# Patient Record
Sex: Female | Born: 1940
Health system: Southern US, Community
[De-identification: ages and names within clinical notes are randomized; demographics above are authoritative.]

## PROBLEM LIST (undated history)

## (undated) DIAGNOSIS — E785 Hyperlipidemia, unspecified: Secondary | ICD-10-CM

## (undated) DIAGNOSIS — U071 COVID-19: Secondary | ICD-10-CM

## (undated) DIAGNOSIS — L719 Rosacea, unspecified: Secondary | ICD-10-CM

## (undated) DIAGNOSIS — F329 Major depressive disorder, single episode, unspecified: Secondary | ICD-10-CM

## (undated) DIAGNOSIS — F32A Depression, unspecified: Secondary | ICD-10-CM

## (undated) DIAGNOSIS — C4490 Unspecified malignant neoplasm of skin, unspecified: Secondary | ICD-10-CM

## (undated) DIAGNOSIS — M810 Age-related osteoporosis without current pathological fracture: Secondary | ICD-10-CM

## (undated) HISTORY — DX: Unspecified malignant neoplasm of skin, unspecified: C44.90

## (undated) HISTORY — PX: OTHER SURGICAL HISTORY: SHX169

## (undated) HISTORY — PX: POLYPECTOMY: SHX149

## (undated) HISTORY — DX: Major depressive disorder, single episode, unspecified: F32.9

## (undated) HISTORY — DX: Depression, unspecified: F32.A

## (undated) HISTORY — DX: Rosacea, unspecified: L71.9

## (undated) HISTORY — DX: Hyperlipidemia, unspecified: E78.5

## (undated) HISTORY — PX: HEMORRHOID SURGERY: SHX153

## (undated) HISTORY — PX: COLONOSCOPY: SHX174

## (undated) HISTORY — PX: PARTIAL HYSTERECTOMY: SHX80

## (undated) HISTORY — DX: Age-related osteoporosis without current pathological fracture: M81.0

---

## 1999-08-29 ENCOUNTER — Other Ambulatory Visit: Admission: RE | Admit: 1999-08-29 | Discharge: 1999-08-29 | Payer: Self-pay | Admitting: Family Medicine

## 2000-06-23 ENCOUNTER — Encounter: Payer: Self-pay | Admitting: Family Medicine

## 2000-06-23 ENCOUNTER — Encounter: Admission: RE | Admit: 2000-06-23 | Discharge: 2000-06-23 | Payer: Self-pay | Admitting: Family Medicine

## 2003-08-10 ENCOUNTER — Encounter: Payer: Self-pay | Admitting: Family Medicine

## 2003-08-10 ENCOUNTER — Encounter: Admission: RE | Admit: 2003-08-10 | Discharge: 2003-08-10 | Payer: Self-pay | Admitting: Family Medicine

## 2003-10-05 ENCOUNTER — Other Ambulatory Visit: Admission: RE | Admit: 2003-10-05 | Discharge: 2003-10-05 | Payer: Self-pay | Admitting: Family Medicine

## 2003-10-20 LAB — FECAL OCCULT BLOOD, GUAIAC: Fecal Occult Blood: NEGATIVE

## 2003-12-19 LAB — HM DEXA SCAN

## 2004-10-08 ENCOUNTER — Ambulatory Visit: Payer: Self-pay | Admitting: Family Medicine

## 2004-10-14 ENCOUNTER — Ambulatory Visit: Payer: Self-pay | Admitting: Family Medicine

## 2004-10-29 ENCOUNTER — Ambulatory Visit: Payer: Self-pay | Admitting: Family Medicine

## 2004-12-04 ENCOUNTER — Ambulatory Visit: Payer: Self-pay | Admitting: Family Medicine

## 2005-03-27 ENCOUNTER — Ambulatory Visit: Payer: Self-pay | Admitting: Family Medicine

## 2006-08-27 ENCOUNTER — Encounter: Payer: Self-pay | Admitting: Family Medicine

## 2006-08-27 ENCOUNTER — Ambulatory Visit: Payer: Self-pay | Admitting: Family Medicine

## 2006-08-27 ENCOUNTER — Other Ambulatory Visit: Admission: RE | Admit: 2006-08-27 | Discharge: 2006-08-27 | Payer: Self-pay | Admitting: Family Medicine

## 2006-09-04 ENCOUNTER — Ambulatory Visit: Payer: Self-pay | Admitting: Family Medicine

## 2006-10-06 ENCOUNTER — Ambulatory Visit: Payer: Self-pay | Admitting: Family Medicine

## 2007-06-28 ENCOUNTER — Ambulatory Visit: Payer: Self-pay | Admitting: Family Medicine

## 2007-06-28 LAB — CONVERTED CEMR LAB
Bacteria, UA: 0
Bilirubin Urine: NEGATIVE
Blood in Urine, dipstick: NEGATIVE
Glucose, Urine, Semiquant: NEGATIVE
Ketones, urine, test strip: NEGATIVE
Nitrite: NEGATIVE
Protein, U semiquant: NEGATIVE
Specific Gravity, Urine: <1.005
Urobilinogen, UA: NEGATIVE
pH: 7

## 2007-12-13 ENCOUNTER — Telehealth: Payer: Self-pay | Admitting: Family Medicine

## 2007-12-13 ENCOUNTER — Ambulatory Visit: Payer: Self-pay | Admitting: Family Medicine

## 2008-01-10 ENCOUNTER — Encounter: Admission: RE | Admit: 2008-01-10 | Discharge: 2008-01-10 | Payer: Self-pay | Admitting: Family Medicine

## 2008-01-10 ENCOUNTER — Telehealth: Payer: Self-pay | Admitting: Family Medicine

## 2008-01-12 ENCOUNTER — Ambulatory Visit: Payer: Self-pay | Admitting: Family Medicine

## 2008-01-12 DIAGNOSIS — E7800 Pure hypercholesterolemia, unspecified: Secondary | ICD-10-CM | POA: Insufficient documentation

## 2008-01-12 DIAGNOSIS — F32A Depression, unspecified: Secondary | ICD-10-CM | POA: Insufficient documentation

## 2008-01-12 DIAGNOSIS — F329 Major depressive disorder, single episode, unspecified: Secondary | ICD-10-CM

## 2008-01-12 DIAGNOSIS — E785 Hyperlipidemia, unspecified: Secondary | ICD-10-CM | POA: Insufficient documentation

## 2008-01-12 DIAGNOSIS — L719 Rosacea, unspecified: Secondary | ICD-10-CM

## 2009-04-13 ENCOUNTER — Ambulatory Visit: Payer: Self-pay | Admitting: Family Medicine

## 2009-04-27 ENCOUNTER — Ambulatory Visit: Payer: Self-pay | Admitting: Family Medicine

## 2009-05-11 ENCOUNTER — Encounter: Admission: RE | Admit: 2009-05-11 | Discharge: 2009-05-11 | Payer: Self-pay | Admitting: Family Medicine

## 2009-05-18 ENCOUNTER — Encounter: Admission: RE | Admit: 2009-05-18 | Discharge: 2009-05-18 | Payer: Self-pay | Admitting: Family Medicine

## 2009-05-28 ENCOUNTER — Ambulatory Visit: Payer: Self-pay | Admitting: Family Medicine

## 2010-12-04 ENCOUNTER — Encounter
Admission: RE | Admit: 2010-12-04 | Discharge: 2010-12-04 | Payer: Self-pay | Source: Home / Self Care | Attending: Family Medicine | Admitting: Family Medicine

## 2010-12-06 ENCOUNTER — Encounter: Payer: Self-pay | Admitting: Family Medicine

## 2010-12-06 ENCOUNTER — Encounter (INDEPENDENT_AMBULATORY_CARE_PROVIDER_SITE_OTHER): Payer: Self-pay | Admitting: *Deleted

## 2010-12-19 NOTE — Letter (Signed)
Summary: Results Follow up Letter  [place] [facility]  [address] [place], [place] [address]   Phone: [phone]  Fax: [phone]    12/06/2010 MRN: [id number]  Connie Bell [address] [place], [place]  [address]  Dear Ms. Vanderheyden,  The following are the results of your recent test(s):  Test         Result    Pap Smear:        Normal _____  Not Normal _____ Comments: ______________________________________________________ Cholesterol: LDL(Bad cholesterol):         Your goal is less than:         HDL (Good cholesterol):       Your goal is more than: Comments:  ______________________________________________________ Mammogram:        Normal __X___  Not Normal _____ Comments: Repeat in 1 year ___________________________________________________________________ Hemoccult:        Normal _____  Not normal _______ Comments:    _____________________________________________________________________ Other Tests:    We routinely do not discuss normal results over the telephone.  If you desire a copy of the results, or you have any questions about this information we can discuss them at your next office visit.   Sincerely,      Dr. Roxy Manns

## 2010-12-19 NOTE — Miscellaneous (Signed)
Summary: Mammgram to flow sheet  Clinical Lists Changes  Observations: Added new observation of MAMMO DUE: 12/2011 (12/06/2010 8:23) Added new observation of MAMMOGRAM: normal (12/04/2010 8:24)      Preventive Care Screening  Mammogram:    Date:  12/04/2010    Next Due:  12/2011    Results:  normal

## 2011-01-08 ENCOUNTER — Ambulatory Visit (INDEPENDENT_AMBULATORY_CARE_PROVIDER_SITE_OTHER): Payer: Medicare Other | Admitting: Family Medicine

## 2011-01-08 ENCOUNTER — Encounter: Payer: Self-pay | Admitting: Family Medicine

## 2011-01-08 DIAGNOSIS — K648 Other hemorrhoids: Secondary | ICD-10-CM

## 2011-01-14 NOTE — Assessment & Plan Note (Signed)
Summary: HEMROIDS/RBH   Vital Signs:  Patient profile:   70 year old female Weight:      164 pounds BMI:     28.47 Temp:     98.1 degrees F oral Pulse rate:   80 / minute Pulse rhythm:   regular BP sitting:   140 / 90  (left arm) Cuff size:   large  Vitals Entered By: Selena Batten Dance CMA Duncan Dull) (January 08, 2011 2:05 PM) CC: ? Hemorrhoids   History of Present Illness: ? 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 Morphine 2)  ! Lipitor 3)  ! Fosamax  Past History:  Past Medical History: Last updated: 04/13/2009 Depression rosacea hyperlipidemia   Past Surgical History: Last updated: 2008-01-18 Hysterectomy-partial, secondary to abn pap Dexa- OP, pt refuses treatment (12/2003) Hemorrhoidectomy (09/2006) Pt refuses mammograms, chol treatmen, vaccines  Family History: Last updated: January 18, 2008 Father: CHF Mother: died age 61- MI Siblings: 2 brothers, 2 sisters- ok.  1 sister died from lung cancer age 55  Social History: Last updated: 04/13/2009 Never Smoked Marital Status: widowed- husband was suicide Children: twins Occupation: retired non smoker  Risk Factors: Smoking Status: never (12/13/2007)  Review of Systems General:  Denies fatigue, fever, loss of appetite, and malaise. CV:  Denies chest pain or discomfort and palpitations. Resp:  Denies cough and wheezing. GI:  Complains of bloody stools and hemorrhoids; denies abdominal pain, change in bowel habits, constipation, dark tarry stools, diarrhea, gas, nausea, vomiting, and vomiting blood. GU:  Denies hematuria. Derm:  Complains of itching; denies  poor wound healing. Endo:  Denies excessive thirst and excessive urination. Heme:  Denies abnormal bruising.  Physical Exam  General:  Well-developed,well-nourished,in no acute distress; alert,appropriate and cooperative throughout examination Head:  normocephalic, atraumatic, and no abnormalities observed.   Neck:  supple with full rom and no masses or thyromegally, no JVD or carotid bruit  Lungs:  Normal respiratory effort, chest expands symmetrically. Lungs are clear to auscultation, no crackles or wheezes. Heart:  Normal rate and regular rhythm. S1 and S2 normal without gallop, murmur, click, rub or other extra sounds. Abdomen:  Bowel sounds positive,abdomen soft and non-tender without masses, organomegaly or hernias noted. no suprapubic tenderness or fullness felt  Rectal:  nl tone  on anoscopy a moderately large hemmorhoid seen at 4:00 -non thrombosed and not actively bleeding  some small ext hem noted heme neg stool noted nontender Skin:  Intact without suspicious lesions or rashes Inguinal Nodes:  No significant adenopathy Psych:  normal affect, talkative and pleasant    Impression & Recommendations:  Problem # 1:  HEMORRHOIDS, INTERNAL (ICD-455.0) Assessment New  with one episode of bleeding  poss exac by lifting at work  hx of surgery in past  one large non thrombosed int hem seen on anoscpy today handout given on hem from [facility]  will tx with anusol hc suppos at bedtime for 14 days update if not imp  Orders: Prescription Created Electronically [phone]) Anoscopy (74259)  Complete Medication List: 1)  Calcium 600/vitamin D 600-200 Mg-unit  Tabs (Calcium carbonate-vitamin d) .... Take 1 tablet by mouth two times a day 2)  Fish Oil 1000 Mg Caps (Omega-3 fatty acids) .... Take 1 tablet by mouth once a day 3)  Anusol-hc 25 Mg Supp (Hydrocortisone acetate) .Marland Kitchen.. 1 per rectum at bedtime for 14 days for hemorroids  Patient Instructions: 1)  use anusol hc suppositories each  bedtime for 10-14 days  2)  avoid straining  3)  update me if symptoms return or worsen  Prescriptions: ANUSOL-HC 25 MG SUPP (HYDROCORTISONE ACETATE) 1 per rectum at bedtime for 14 days for hemorroids  #14 x 0   Entered and Authorized by:   Judith Part MD   Signed by:   Judith Part MD on 01/08/2011   Method used:   Electronically to        [facility]  [address] (retail)       [address]       [place], [place]  [address]       Ph: [phone] or [phone]       Fax: [phone]   RxID:   [id number]    Orders Added: 1)  Prescription Created Electronically [G8553] 2)  Est. Patient Level III [95638] 3)  Anoscopy [46600]    Current Allergies (reviewed today): ! MORPHINE ! LIPITOR ! FOSAMAX

## 2011-10-27 ENCOUNTER — Encounter: Payer: Self-pay | Admitting: Family Medicine

## 2011-10-28 ENCOUNTER — Ambulatory Visit (INDEPENDENT_AMBULATORY_CARE_PROVIDER_SITE_OTHER): Payer: Medicare Other | Admitting: Family Medicine

## 2011-10-28 ENCOUNTER — Encounter: Payer: Self-pay | Admitting: Family Medicine

## 2011-10-28 DIAGNOSIS — J40 Bronchitis, not specified as acute or chronic: Secondary | ICD-10-CM

## 2011-10-28 DIAGNOSIS — K649 Unspecified hemorrhoids: Secondary | ICD-10-CM | POA: Insufficient documentation

## 2011-10-28 MED ORDER — GUAIFENESIN-CODEINE 100-10 MG/5ML PO SYRP: 5.0000 mL | ORAL_SOLUTION | Freq: Two times a day (BID) | ORAL | Status: AC | PRN

## 2011-10-28 MED ORDER — GUAIFENESIN-CODEINE 100-10 MG/5ML PO SYRP: 180.0000 mL | ORAL_SOLUTION | Freq: Two times a day (BID) | ORAL | Status: DC | PRN

## 2011-10-28 MED ORDER — HYDROCORTISONE 2.5 % RE CREA: TOPICAL_CREAM | RECTAL | Status: AC

## 2011-10-28 MED ORDER — AZITHROMYCIN 250 MG PO TABS: ORAL_TABLET | ORAL | Status: DC

## 2011-10-28 NOTE — Assessment & Plan Note (Signed)
Given lung findings, will treat aggressively with zpack. cheratussin for cough. Update [place] if sxs not improving as expected or worsening.

## 2011-10-28 NOTE — Assessment & Plan Note (Signed)
Known ext hemorrhoids per pt. Treat with anusol HC, warm sitz baths.  Pt advised to return if not improving with this measure for further evaluation.

## 2011-10-28 NOTE — Progress Notes (Signed)
Addended by: Eustaquio Boyden on: 10/28/2011 09:02 AM   Modules accepted: Orders

## 2011-10-28 NOTE — Patient Instructions (Signed)
For hemorrhoids - use anusol HC cream twice daily for 10 days.  Return if not improving or worsening. For cough - you have bronchitis.  Treat with zpack and cheratussin for cough at night. Get plenty of fluids and plenty of rest. If fever >101.5, or worsening productive cough please return to be seen again. Call us with questions.

## 2011-10-28 NOTE — Progress Notes (Signed)
  Subjective:    Patient ID: Connie Bell, female    DOB: [birth year], 70 y.o.   MRN: [id number]  HPI CC: cough  70 yo with h/o HLD presents with 5d h/o ST, fever to 100.9.  Went home from work sick.  Mild nausea, diarrhea over weekend.  Remaining cough and chest congestion.  + arthralgia.  Mild SOB.  + fatigue.  Dry cough, no more fevers.  Has been trying to get in for OV for last 3 days.  So far has tried delsym, advil.  No abd pain, ear pain, tooth pain, rash, myalgia, chest pain.  Works at school.  No smokers at home.  No h/o asthma/COPD.  Also 1 wk h/o external hemorrhoids acting up.  H/o hemorrhoids, s/p surgery 2 yrs ago.  Some bleeding.  Has tried prep H and suppositories.  Review of Systems Per HPI    Objective:   Physical Exam  Vitals reviewed. Constitutional: She appears well-developed and well-nourished. No distress.  HENT:  Head: Normocephalic and atraumatic.  Right Ear: Hearing, tympanic membrane, external ear and ear canal normal.  Left Ear: Hearing, tympanic membrane, external ear and ear canal normal.  Nose: No mucosal edema or rhinorrhea. Right sinus exhibits no maxillary sinus tenderness and no frontal sinus tenderness. Left sinus exhibits no maxillary sinus tenderness and no frontal sinus tenderness.  Mouth/Throat: Uvula is midline, oropharynx is clear and moist and mucous membranes are normal. No oropharyngeal exudate, posterior oropharyngeal edema, posterior oropharyngeal erythema or tonsillar abscesses.  Eyes: Conjunctivae and EOM are normal. Pupils are equal, round, and reactive to light. No scleral icterus.  Neck: Normal range of motion. Neck supple. No JVD present. No thyromegaly present.  Cardiovascular: Normal rate, regular rhythm, normal heart sounds and intact distal pulses.   No murmur heard. Pulmonary/Chest: Effort normal. No respiratory distress. She has no decreased breath sounds. She has no wheezes. She has rhonchi. She has rales in the left  middle field and the left lower field.  Lymphadenopathy:    She has no cervical adenopathy.  Skin: Skin is warm and dry. No rash noted.       Assessment & Plan:

## 2012-02-17 ENCOUNTER — Encounter: Payer: Self-pay | Admitting: Family Medicine

## 2012-02-17 ENCOUNTER — Ambulatory Visit (INDEPENDENT_AMBULATORY_CARE_PROVIDER_SITE_OTHER): Payer: Medicare Other | Admitting: Family Medicine

## 2012-02-17 VITALS — BP 142/84 | HR 84 | Temp 98.5°F | Wt 165.0 lb

## 2012-02-17 DIAGNOSIS — J40 Bronchitis, not specified as acute or chronic: Secondary | ICD-10-CM

## 2012-02-17 MED ORDER — AZITHROMYCIN 250 MG PO TABS: ORAL_TABLET | ORAL | Status: AC

## 2012-02-17 MED ORDER — HYDROCOD POLST-CHLORPHEN POLST 10-8 MG/5ML PO LQCR: 5.0000 mL | Freq: Two times a day (BID) | ORAL | Status: DC | PRN

## 2012-02-17 NOTE — Patient Instructions (Signed)
Good to see you. Please take Zpack as directed. Continue Mucinex, take Tussionex as needed at night.

## 2012-02-17 NOTE — Progress Notes (Signed)
SUBJECTIVE:  Connie Bell is a 71 y.o. female who complains of dry cough, myalgias and headache for 4 days. She denies a history of anorexia, chest pain, chills, dizziness, shortness of breath, vomiting and weakness and denies a history of asthma. Patient denies smoke cigarettes.   Patient Active Problem List  Diagnoses  . HYPERCHOLESTEROLEMIA  . DEPRESSION  . ROSACEA  . HEMORRHOIDS, INTERNAL  . Bronchitis  . Hemorrhoids   Past Medical History  Diagnosis Date  . Depression   . Rosacea   . Hyperlipemia    Past Surgical History  Procedure Date  . Partial hysterectomy     secondary to abn pap  . Hemorrhoid surgery   . Patient refuses     mammograms, chol treatment and vaccines   History  Substance Use Topics  . Smoking status: Never Smoker   . Smokeless tobacco: Never Used  . Alcohol Use: No   Family History  Problem Relation Age of Onset  . Heart disease Mother     MI  . Heart disease Father     CHF  . Cancer Sister 83    lung cancer   Allergies  Allergen Reactions  . Alendronate Sodium     REACTION: depressed, headaches, stomach aches  . Atorvastatin     REACTION: u/k  . Morphine     REACTION: u/k   Current Outpatient Prescriptions on File Prior to Visit  Medication Sig Dispense Refill  . Calcium Carbonate-Vitamin D 600-200 MG-UNIT TABS Take by mouth daily.        . Cholecalciferol (VITAMIN D) 2000 UNITS CAPS Take by mouth daily.        . hydrocortisone (ANUSOL-HC) 2.5 % rectal cream Apply rectally 2 times daily  30 g  1  . Omega-3 Fatty Acids (FISH OIL) 1000 MG CAPS Take by mouth daily.         The PMH, PSH, Social History, Family History, Medications, and allergies have been reviewed in [facility], and have been updated if relevant.  OBJECTIVE: BP 142/84  Pulse 84  Temp(Src) 98.5 F (36.9 C) (Oral)  Wt 165 lb (74.844 kg)  SpO2 96%  She appears well, vital signs are as noted. Ears normal.  Throat and pharynx normal.  Neck supple. No adenopathy in the  neck. Nose is congested. Sinuses non tender. Pos rales LLL, otherwise clear.  ASSESSMENT:  bronchitis  PLAN: Due to abnormal lung sounds, will treat with Zpack. Tussionex as needed for cough (she states she is not allergic). Symptomatic therapy suggested: push fluids, rest and return office visit prn if symptoms persist or worsen.  Call or return to clinic prn if these symptoms worsen or fail to improve as anticipated.

## 2012-08-06 ENCOUNTER — Other Ambulatory Visit: Payer: Self-pay | Admitting: Family Medicine

## 2012-08-06 DIAGNOSIS — Z1231 Encounter for screening mammogram for malignant neoplasm of breast: Secondary | ICD-10-CM

## 2012-09-02 ENCOUNTER — Encounter: Payer: Self-pay | Admitting: *Deleted

## 2012-09-02 ENCOUNTER — Ambulatory Visit
Admission: RE | Admit: 2012-09-02 | Discharge: 2012-09-02 | Disposition: A | Discharge status: Home / Self Care | Payer: Medicare Other | Source: Ambulatory Visit | Attending: Family Medicine | Admitting: Family Medicine

## 2012-09-02 DIAGNOSIS — Z1231 Encounter for screening mammogram for malignant neoplasm of breast: Secondary | ICD-10-CM

## 2012-11-24 ENCOUNTER — Ambulatory Visit (INDEPENDENT_AMBULATORY_CARE_PROVIDER_SITE_OTHER): Payer: Medicare Other | Admitting: Family Medicine

## 2012-11-24 ENCOUNTER — Encounter: Payer: Self-pay | Admitting: Family Medicine

## 2012-11-24 VITALS — BP 158/88 | HR 78 | Temp 98.4°F | Ht 65.0 in | Wt 163.8 lb

## 2012-11-24 DIAGNOSIS — R300 Dysuria: Secondary | ICD-10-CM

## 2012-11-24 DIAGNOSIS — B373 Candidiasis of vulva and vagina: Secondary | ICD-10-CM

## 2012-11-24 LAB — POCT WET PREP (WET MOUNT)

## 2012-11-24 LAB — POCT UA - MICROSCOPIC ONLY: Casts, Ur, LPF, POC: 0

## 2012-11-24 LAB — POCT URINALYSIS DIPSTICK
Glucose, UA: NEGATIVE
Nitrite, UA: POSITIVE
Protein, UA: NEGATIVE
Urobilinogen, UA: 0.2

## 2012-11-24 MED ORDER — FLUCONAZOLE 150 MG PO TABS: 150.0000 mg | ORAL_TABLET | Freq: Once | ORAL | Status: DC

## 2012-11-24 NOTE — Assessment & Plan Note (Signed)
Most likely from yeast vaginitis, but will cx urine due to wbc seen on micro

## 2012-11-24 NOTE — Progress Notes (Signed)
Subjective:    Patient ID: Connie Bell, female    DOB: [birth year], 72 y.o.   MRN: [id number]  HPI Here with ? uti or vaginitis  Some burning to urinate - hurts for the urine to touch skin- which is raw No vag d/c or itch - just raw  No frequency of urination  Urine has a bad odor  No pelvic pain or cramping   No fever or nausea   Some urgency of urination No blood in urine   Symptoms - for about 2 weeks No otc meds Drinks water all the time   Patient Active Problem List  Diagnosis  . HYPERCHOLESTEROLEMIA  . DEPRESSION  . ROSACEA  . HEMORRHOIDS, INTERNAL  . Bronchitis  . Hemorrhoids   Past Medical History  Diagnosis Date  . Depression   . Rosacea   . Hyperlipemia    Past Surgical History  Procedure Date  . Partial hysterectomy     secondary to abn pap  . Hemorrhoid surgery   . Patient refuses     mammograms, chol treatment and vaccines   History  Substance Use Topics  . Smoking status: Never Smoker   . Smokeless tobacco: Never Used  . Alcohol Use: No   Family History  Problem Relation Age of Onset  . Heart disease Mother     MI  . Heart disease Father     CHF  . Cancer Sister 66    lung cancer   Allergies  Allergen Reactions  . Alendronate Sodium     REACTION: depressed, headaches, stomach aches  . Atorvastatin     REACTION: u/k  . Morphine     REACTION: u/k   Current Outpatient Prescriptions on File Prior to Visit  Medication Sig Dispense Refill  . Calcium Carbonate-Vitamin D 600-200 MG-UNIT TABS Take by mouth daily.        . Cholecalciferol (VITAMIN D) 2000 UNITS CAPS Take by mouth daily.        . Omega-3 Fatty Acids (FISH OIL) 1000 MG CAPS Take 2 by mouth daily          Review of Systems    Review of Systems  Constitutional: Negative for fever, appetite change, fatigue and unexpected weight change.  Eyes: Negative for pain and visual disturbance.  Respiratory: Negative for cough and shortness of breath.   Cardiovascular:  Negative for cp or palpitations    Gastrointestinal: Negative for nausea, diarrhea and constipation.  Genitourinary: Negative for urgency and frequency. pos for dysuria and vaginal burning/ irritation, neg for blood in urine Skin: Negative for pallor or rash   Neurological: Negative for weakness, light-headedness, numbness and headaches.  Hematological: Negative for adenopathy. Does not bruise/bleed easily.  Psychiatric/Behavioral: Negative for dysphoric mood. The patient is not nervous/anxious.      Objective:   Physical Exam  Constitutional: She appears well-developed and well-nourished. No distress.  HENT:  Head: Normocephalic and atraumatic.  Mouth/Throat: Oropharynx is clear and moist.  Eyes: Conjunctivae normal and EOM are normal. Pupils are equal, round, and reactive to light. No scleral icterus.  Neck: Normal range of motion. Neck supple.  Cardiovascular: Normal rate and regular rhythm.   Abdominal: Soft. Bowel sounds are normal. She exhibits no distension and no mass. There is no tenderness.       No suprapubic tenderness or fullness    Genitourinary:       Mild hyperemia of labia minora without lesions No d/c  Musculoskeletal:  No cva tenderness   Lymphadenopathy:    She has no cervical adenopathy.  Neurological: She is alert.  Skin: No rash noted.  Psychiatric: She has a normal mood and affect.          Assessment & Plan:

## 2012-11-24 NOTE — Patient Instructions (Addendum)
For yeast vaginitis take the diflucan pill once orally You can pick up some monistat cream over the counter to use externally as well  We will culture urine and call you with result

## 2012-11-24 NOTE — Assessment & Plan Note (Signed)
With hyphae on wet prep tx with diflucan Monistat cream otc externally  Update if no imp  Will also cx urine due to dysuria and wbc in urine

## 2012-11-27 LAB — URINE CULTURE: Colony Count: >=100000

## 2012-11-28 ENCOUNTER — Telehealth: Payer: Self-pay | Admitting: Family Medicine

## 2012-11-28 MED ORDER — SULFAMETHOXAZOLE-TRIMETHOPRIM 800-160 MG PO TABS: 1.0000 | ORAL_TABLET | Freq: Two times a day (BID) | ORAL | Status: DC

## 2012-11-28 NOTE — Telephone Encounter (Signed)
Let pt know her urine cx showed some infection - uti Please call in septra Let me know if no improvement

## 2012-11-29 NOTE — Telephone Encounter (Signed)
Pt notified of urine cx results and Rx called in as prescribed

## 2013-07-20 ENCOUNTER — Ambulatory Visit (INDEPENDENT_AMBULATORY_CARE_PROVIDER_SITE_OTHER): Payer: Medicare Other | Admitting: *Deleted

## 2013-07-20 DIAGNOSIS — Z111 Encounter for screening for respiratory tuberculosis: Secondary | ICD-10-CM

## 2013-07-22 LAB — TB SKIN TEST: TB Skin Test: NEGATIVE

## 2013-08-26 ENCOUNTER — Encounter: Payer: Self-pay | Admitting: Family Medicine

## 2013-08-26 ENCOUNTER — Ambulatory Visit (INDEPENDENT_AMBULATORY_CARE_PROVIDER_SITE_OTHER): Payer: Medicare Other | Admitting: Family Medicine

## 2013-08-26 VITALS — BP 134/88 | HR 76 | Temp 97.8°F | Ht 63.25 in | Wt 163.8 lb

## 2013-08-26 DIAGNOSIS — Z0289 Encounter for other administrative examinations: Secondary | ICD-10-CM

## 2013-08-26 DIAGNOSIS — Z021 Encounter for pre-employment examination: Secondary | ICD-10-CM | POA: Insufficient documentation

## 2013-08-26 DIAGNOSIS — Z23 Encounter for immunization: Secondary | ICD-10-CM

## 2013-08-26 NOTE — Assessment & Plan Note (Signed)
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

## 2013-08-26 NOTE — Progress Notes (Signed)
Subjective:    Patient ID: Connie Bell, female    DOB: 29-Jan-1941, 72 y.o.   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 Yeast vaginitis 11/24/2012  . Dysuria 11/24/2012  . Bronchitis 10/28/2011  . Hemorrhoids 10/28/2011  . HEMORRHOIDS, INTERNAL 01/08/2011  . HYPERCHOLESTEROLEMIA 01/12/2008  . DEPRESSION 01/12/2008  . ROSACEA 01/12/2008   Past Medical History  Diagnosis Date  . Depression   . Rosacea   . Hyperlipemia    Past Surgical History  Procedure Laterality Date  . Partial hysterectomy      secondary to abn pap  . Hemorrhoid surgery    . Patient refuses      mammograms, chol treatment and vaccines   History  Substance Use Topics  . Smoking status: Never Smoker   . Smokeless tobacco: Never Used  . Alcohol Use: No   Family History  Problem Relation Age of Onset  . Heart disease Mother     MI  . Heart disease Father     CHF  . Cancer Sister 63    lung cancer   Allergies  Allergen Reactions  . Alendronate Sodium     REACTION: depressed, headaches, stomach aches  . Atorvastatin     REACTION: u/k  . Morphine     REACTION: u/k   Current Outpatient Prescriptions on File Prior to Visit  Medication Sig Dispense Refill  . Calcium Carbonate-Vitamin D 600-200 MG-UNIT TABS Take by mouth daily.        . Cholecalciferol (VITAMIN D) 2000 UNITS CAPS Take by mouth daily.        . Omega-3 Fatty Acids (FISH OIL) 1000 MG  CAPS Take 2 by mouth daily       No current facility-administered medications on file prior to visit.     Review of Systems Review of Systems  Constitutional: Negative for fever, appetite change, fatigue and unexpected weight change.  Eyes: Negative for pain and visual disturbance.  Respiratory: Negative for cough and shortness of breath.   Cardiovascular: Negative for cp or palpitations    Gastrointestinal: Negative for nausea, diarrhea and constipation.  Genitourinary: Negative for urgency and frequency.  Skin: Negative for pallor or rash   Neurological: Negative for weakness, light-headedness, numbness and headaches.  Hematological: Negative for adenopathy. Does not bruise/bleed easily.  Psychiatric/Behavioral: Negative for dysphoric mood. The patient is not nervous/anxious.         Objective:   Physical Exam  Constitutional: She appears well-developed and well-nourished. No distress.  overwt and well appearing   HENT:  Head: Normocephalic and atraumatic.  Mouth/Throat: Oropharynx is clear and moist.  Eyes: Conjunctivae and EOM are normal. Pupils are equal, round, and reactive to light. Right eye exhibits no discharge. Left eye exhibits no discharge. No scleral icterus.  Neck: Normal range of motion. Neck supple. No JVD  present. Carotid bruit is not present. No thyromegaly present.  Cardiovascular: Normal rate, regular rhythm and intact distal pulses.  Exam reveals no gallop.   Pulmonary/Chest: Effort normal and breath sounds normal. No respiratory distress. She has no wheezes. She has no rales.  Abdominal: Soft. Bowel sounds are normal. She exhibits no distension, no abdominal bruit and no mass. There is no tenderness.  Musculoskeletal: She exhibits no edema.  Lymphadenopathy:    She has no cervical adenopathy.  Neurological: She is alert. She has normal reflexes. No cranial nerve deficit. She exhibits normal muscle tone. Coordination normal.  Skin: Skin is warm and dry. No  rash noted. No erythema. No pallor.  Psychiatric: She has a normal mood and affect.          Assessment & Plan:

## 2013-08-26 NOTE — Patient Instructions (Signed)
You are due for a tetanus (Tdap vaccine)- check into getting that at the health dept due to cost  Flu shot here today A pneumovax (pneumonia vaccine) is recommended for all over 65 Also the shingles shot is recommended in all over 60 (If you are interested in a shingles/zoster vaccine - call your insurance to check on coverage,( you should not get it within 1 month of other vaccines) , then call us for a prescription  for it to take to a pharmacy that gives the shot , or make a nurse visit to get it here depending on your coverage)   Here is some info

## 2014-08-19 ENCOUNTER — Encounter (HOSPITAL_COMMUNITY): Payer: Self-pay | Admitting: Emergency Medicine

## 2014-08-19 DIAGNOSIS — Z79899 Other long term (current) drug therapy: Secondary | ICD-10-CM | POA: Diagnosis not present

## 2014-08-19 DIAGNOSIS — R197 Diarrhea, unspecified: Secondary | ICD-10-CM | POA: Insufficient documentation

## 2014-08-19 DIAGNOSIS — R112 Nausea with vomiting, unspecified: Secondary | ICD-10-CM | POA: Insufficient documentation

## 2014-08-19 DIAGNOSIS — Z8639 Personal history of other endocrine, nutritional and metabolic disease: Secondary | ICD-10-CM | POA: Diagnosis not present

## 2014-08-19 DIAGNOSIS — Z872 Personal history of diseases of the skin and subcutaneous tissue: Secondary | ICD-10-CM | POA: Insufficient documentation

## 2014-08-19 DIAGNOSIS — Z8659 Personal history of other mental and behavioral disorders: Secondary | ICD-10-CM | POA: Diagnosis not present

## 2014-08-19 DIAGNOSIS — R42 Dizziness and giddiness: Secondary | ICD-10-CM | POA: Insufficient documentation

## 2014-08-19 DIAGNOSIS — R55 Syncope and collapse: Secondary | ICD-10-CM | POA: Insufficient documentation

## 2014-08-19 LAB — CBC WITH DIFFERENTIAL/PLATELET
Basophils Absolute: 0 K/uL (ref 0.0–0.1)
Basophils Relative: 0 % (ref 0–1)
Eosinophils Absolute: 0.2 K/uL (ref 0.0–0.7)
Eosinophils Relative: 3 % (ref 0–5)
HCT: 39.8 % (ref 36.0–46.0)
Hemoglobin: 13.4 g/dL (ref 12.0–15.0)
Lymphocytes Relative: 25 % (ref 12–46)
Lymphs Abs: 2.1 K/uL (ref 0.7–4.0)
MCH: 30.8 pg (ref 26.0–34.0)
MCHC: 33.7 g/dL (ref 30.0–36.0)
MCV: 91.5 fL (ref 78.0–100.0)
Monocytes Absolute: 0.6 K/uL (ref 0.1–1.0)
Monocytes Relative: 7 % (ref 3–12)
Neutro Abs: 5.6 K/uL (ref 1.7–7.7)
Neutrophils Relative %: 65 % (ref 43–77)
Platelets: 328 K/uL (ref 150–400)
RBC: 4.35 MIL/uL (ref 3.87–5.11)
RDW: 13.8 % (ref 11.5–15.5)
WBC: 8.4 K/uL (ref 4.0–10.5)

## 2014-08-19 LAB — COMPREHENSIVE METABOLIC PANEL WITH GFR
ALT: 14 U/L (ref 0–35)
AST: 16 U/L (ref 0–37)
Albumin: 3.9 g/dL (ref 3.5–5.2)
Alkaline Phosphatase: 92 U/L (ref 39–117)
Anion gap: 12 (ref 5–15)
BUN: 19 mg/dL (ref 6–23)
CO2: 28 meq/L (ref 19–32)
Calcium: 9.4 mg/dL (ref 8.4–10.5)
Chloride: 99 meq/L (ref 96–112)
Creatinine, Ser: 0.82 mg/dL (ref 0.50–1.10)
GFR calc Af Amer: 81 mL/min — ABNORMAL LOW
GFR calc non Af Amer: 70 mL/min — ABNORMAL LOW
Glucose, Bld: 143 mg/dL — ABNORMAL HIGH (ref 70–99)
Potassium: 3.8 meq/L (ref 3.7–5.3)
Sodium: 139 meq/L (ref 137–147)
Total Bilirubin: 0.3 mg/dL (ref 0.3–1.2)
Total Protein: 8.2 g/dL (ref 6.0–8.3)

## 2014-08-19 LAB — TROPONIN I

## 2014-08-19 LAB — CBG MONITORING, ED: GLUCOSE-CAPILLARY: 128 mg/dL — AB (ref 70–99)

## 2014-08-19 NOTE — ED Notes (Signed)
The pt has no facial droop.  No arm drift.  Equal grips bi-laterally

## 2014-08-19 NOTE — ED Notes (Signed)
The pt was sitting down and stood up to go to the br.  She began to have dizziness with nausea and vomniting and passed out.  She has vomited x 5 times since the initial  Start of her symptoms. She feels weak and dizzy now.  Her  Vomiting has subsided at present

## 2014-08-20 ENCOUNTER — Emergency Department (HOSPITAL_COMMUNITY): Payer: Medicare Other

## 2014-08-20 ENCOUNTER — Emergency Department (HOSPITAL_COMMUNITY)
Admission: EM | Admit: 2014-08-20 | Discharge: 2014-08-20 | Disposition: A | Discharge status: Home / Self Care | Payer: Medicare Other | Attending: Emergency Medicine | Admitting: Emergency Medicine

## 2014-08-20 DIAGNOSIS — R42 Dizziness and giddiness: Secondary | ICD-10-CM

## 2014-08-20 DIAGNOSIS — R197 Diarrhea, unspecified: Secondary | ICD-10-CM

## 2014-08-20 DIAGNOSIS — R112 Nausea with vomiting, unspecified: Secondary | ICD-10-CM

## 2014-08-20 DIAGNOSIS — R55 Syncope and collapse: Secondary | ICD-10-CM

## 2014-08-20 LAB — URINALYSIS, ROUTINE W REFLEX MICROSCOPIC
BILIRUBIN URINE: NEGATIVE
Glucose, UA: NEGATIVE mg/dL
Hgb urine dipstick: NEGATIVE
Ketones, ur: NEGATIVE mg/dL
NITRITE: NEGATIVE
Protein, ur: NEGATIVE mg/dL
Specific Gravity, Urine: 1.007 (ref 1.005–1.030)
UROBILINOGEN UA: 0.2 mg/dL (ref 0.0–1.0)
pH: 7 (ref 5.0–8.0)

## 2014-08-20 LAB — I-STAT TROPONIN, ED: Troponin i, poc: 0 ng/mL (ref 0.00–0.08)

## 2014-08-20 LAB — URINE MICROSCOPIC-ADD ON

## 2014-08-20 MED ORDER — SODIUM CHLORIDE 0.9 % IV BOLUS (SEPSIS)
1000.0000 mL | Freq: Once | INTRAVENOUS | Status: AC
  Administered 2014-08-20: 1000 mL via INTRAVENOUS

## 2014-08-20 MED ORDER — ONDANSETRON 4 MG PO TBDP: 4.0000 mg | ORAL_TABLET | Freq: Three times a day (TID) | ORAL | Status: DC | PRN

## 2014-08-20 MED ORDER — ONDANSETRON HCL 4 MG/2ML IJ SOLN
4.0000 mg | Freq: Once | INTRAMUSCULAR | Status: AC
  Administered 2014-08-20: 4 mg via INTRAVENOUS
  Filled 2014-08-20: qty 2

## 2014-08-20 MED ORDER — MECLIZINE HCL 50 MG PO TABS: 50.0000 mg | ORAL_TABLET | Freq: Three times a day (TID) | ORAL | Status: DC | PRN

## 2014-08-20 NOTE — Discharge Instructions (Signed)
Diarrhea Diarrhea is frequent loose and watery bowel movements. It can cause you to feel weak and dehydrated. Dehydration can cause you to become tired and thirsty, have a dry mouth, and have decreased urination that often is dark yellow. Diarrhea is a sign of another problem, most often an infection that will not last long. In most cases, diarrhea typically lasts 2-3 days. However, it can last longer if it is a sign of something more serious. It is important to treat your diarrhea as directed by your caregiver to lessen or prevent future episodes of diarrhea. CAUSES  Some common causes include:  Gastrointestinal infections caused by viruses, bacteria, or parasites.  Food poisoning or food allergies.  Certain medicines, such as antibiotics, chemotherapy, and laxatives.  Artificial sweeteners and fructose.  Digestive disorders. HOME CARE INSTRUCTIONS  Ensure adequate fluid intake (hydration): Have 1 cup (8 oz) of fluid for each diarrhea episode. Avoid fluids that contain simple sugars or sports drinks, fruit juices, whole milk products, and sodas. Your urine should be clear or pale yellow if you are drinking enough fluids. Hydrate with an oral rehydration solution that you can purchase at pharmacies, retail stores, and online. You can prepare an oral rehydration solution at home by mixing the following ingredients together:   - tsp table salt.   tsp baking soda.   tsp salt substitute containing potassium chloride.  1  tablespoons sugar.  1 L (34 oz) of water.  Certain foods and beverages may increase the speed at which food moves through the gastrointestinal (GI) tract. These foods and beverages should be avoided and include:  Caffeinated and alcoholic beverages.  High-fiber foods, such as raw fruits and vegetables, nuts, seeds, and whole grain breads and cereals.  Foods and beverages sweetened with sugar alcohols, such as xylitol, sorbitol, and mannitol.  Some foods may be well  tolerated and may help thicken stool including:  Starchy foods, such as rice, toast, pasta, low-sugar cereal, oatmeal, grits, baked potatoes, crackers, and bagels.  Bananas.  Applesauce.  Add probiotic-rich foods to help increase healthy bacteria in the GI tract, such as yogurt and fermented milk products.  Wash your hands well after each diarrhea episode.  Only take over-the-counter or prescription medicines as directed by your caregiver.  Take a warm bath to relieve any burning or pain from frequent diarrhea episodes. SEEK IMMEDIATE MEDICAL CARE IF:   You are unable to keep fluids down.  You have persistent vomiting.  You have blood in your stool, or your stools are black and tarry.  You do not urinate in 6-8 hours, or there is only a small amount of very dark urine.  You have abdominal pain that increases or localizes.  You have weakness, dizziness, confusion, or light-headedness.  You have a severe headache.  Your diarrhea gets worse or does not get better.  You have a fever or persistent symptoms for more than 2-3 days.  You have a fever and your symptoms suddenly get worse. MAKE SURE YOU:   Understand these instructions.  Will watch your condition.  Will get help right away if you are not doing well or get worse. Document Released: 10/24/2002 Document Revised: 03/20/2014 Document Reviewed: 07/11/2012 [facility] Patient Information 2015 [place], [place]. This information is not intended to replace advice given to you by your health care provider. Make sure you discuss any questions you have with your health care provider.  Nausea and Vomiting Nausea is a sick feeling that often comes before throwing up (vomiting). Vomiting  is a reflex where stomach contents come out of your mouth. Vomiting can cause severe loss of body fluids (dehydration). Children and elderly adults can become dehydrated quickly, especially if they also have diarrhea. Nausea and vomiting are  symptoms of a condition or disease. It is important to find the cause of your symptoms. CAUSES   Direct irritation of the stomach lining. This irritation can result from increased acid production (gastroesophageal reflux disease), infection, food poisoning, taking certain medicines (such as nonsteroidal anti-inflammatory drugs), alcohol use, or tobacco use.  Signals from the brain.These signals could be caused by a headache, heat exposure, an inner ear disturbance, increased pressure in the brain from injury, infection, a tumor, or a concussion, pain, emotional stimulus, or metabolic problems.  An obstruction in the gastrointestinal tract (bowel obstruction).  Illnesses such as diabetes, hepatitis, gallbladder problems, appendicitis, kidney problems, cancer, sepsis, atypical symptoms of a heart attack, or eating disorders.  Medical treatments such as chemotherapy and radiation.  Receiving medicine that makes you sleep (general anesthetic) during surgery. DIAGNOSIS Your caregiver may ask for tests to be done if the problems do not improve after a few days. Tests may also be done if symptoms are severe or if the reason for the nausea and vomiting is not clear. Tests may include:  Urine tests.  Blood tests.  Stool tests.  Cultures (to look for evidence of infection).  X-rays or other imaging studies. Test results can help your caregiver make decisions about treatment or the need for additional tests. TREATMENT You need to stay well hydrated. Drink frequently but in small amounts.You may wish to drink water, sports drinks, clear broth, or eat frozen ice pops or gelatin dessert to help stay hydrated.When you eat, eating slowly may help prevent nausea.There are also some antinausea medicines that may help prevent nausea. HOME CARE INSTRUCTIONS   Take all medicine as directed by your caregiver.  If you do not have an appetite, do not force yourself to eat. However, you must continue to  drink fluids.  If you have an appetite, eat a normal diet unless your caregiver tells you differently.  Eat a variety of complex carbohydrates (rice, wheat, potatoes, bread), lean meats, yogurt, fruits, and vegetables.  Avoid high-fat foods because they are more difficult to digest.  Drink enough water and fluids to keep your urine clear or pale yellow.  If you are dehydrated, ask your caregiver for specific rehydration instructions. Signs of dehydration may include:  Severe thirst.  Dry lips and mouth.  Dizziness.  Dark urine.  Decreasing urine frequency and amount.  Confusion.  Rapid breathing or pulse. SEEK IMMEDIATE MEDICAL CARE IF:   You have blood or brown flecks (like coffee grounds) in your vomit.  You have black or bloody stools.  You have a severe headache or stiff neck.  You are confused.  You have severe abdominal pain.  You have chest pain or trouble breathing.  You do not urinate at least once every 8 hours.  You develop cold or clammy skin.  You continue to vomit for longer than 24 to 48 hours.  You have a fever. MAKE SURE YOU:   Understand these instructions.  Will watch your condition.  Will get help right away if you are not doing well or get worse. Document Released: 11/03/2005 Document Revised: 01/26/2012 Document Reviewed: 04/02/2011 [facility] Patient Information 2015 [place], [place]. This information is not intended to replace advice given to you by your health care provider. Make sure you discuss  any questions you have with your health care provider.  Syncope Syncope is a medical term for fainting or passing out. This means you lose consciousness and drop to the ground. People are generally unconscious for less than 5 minutes. You may have some muscle twitches for up to 15 seconds before waking up and returning to normal. Syncope occurs more often in older adults, but it can happen to anyone. While most causes of syncope are not  dangerous, syncope can be a sign of a serious medical problem. It is important to seek medical care.  CAUSES  Syncope is caused by a sudden drop in blood flow to the brain. The specific cause is often not determined. Factors that can bring on syncope include:  Taking medicines that lower blood pressure.  Sudden changes in posture, such as standing up quickly.  Taking more medicine than prescribed.  Standing in one place for too long.  Seizure disorders.  Dehydration and excessive exposure to heat.  Low blood sugar (hypoglycemia).  Straining to have a bowel movement.  Heart disease, irregular heartbeat, or other circulatory problems.  Fear, emotional distress, seeing blood, or severe pain. SYMPTOMS  Right before fainting, you may:  Feel dizzy or light-headed.  Feel nauseous.  See all white or all black in your field of vision.  Have cold, clammy skin. DIAGNOSIS  Your health care provider will ask about your symptoms, perform a physical exam, and perform an electrocardiogram (ECG) to record the electrical activity of your heart. Your health care provider may also perform other heart or blood tests to determine the cause of your syncope which may include:  Transthoracic echocardiogram (TTE). During echocardiography, sound waves are used to evaluate how blood flows through your heart.  Transesophageal echocardiogram (TEE).  Cardiac monitoring. This allows your health care provider to monitor your heart rate and rhythm in real time.  Holter monitor. This is a portable device that records your heartbeat and can help diagnose heart arrhythmias. It allows your health care provider to track your heart activity for several days, if needed.  Stress tests by exercise or by giving medicine that makes the heart beat faster. TREATMENT  In most cases, no treatment is needed. Depending on the cause of your syncope, your health care provider may recommend changing or stopping some of your  medicines. HOME CARE INSTRUCTIONS  Have someone stay with you until you feel stable.  Do not drive, use machinery, or play sports until your health care provider says it is okay.  Keep all follow-up appointments as directed by your health care provider.  Lie down right away if you start feeling like you might faint. Breathe deeply and steadily. Wait until all the symptoms have passed.  Drink enough fluids to keep your urine clear or pale yellow.  If you are taking blood pressure or heart medicine, get up slowly and take several minutes to sit and then stand. This can reduce dizziness. SEEK IMMEDIATE MEDICAL CARE IF:   You have a severe headache.  You have unusual pain in the chest, abdomen, or back.  You are bleeding from your mouth or rectum, or you have black or tarry stool.  You have an irregular or very fast heartbeat.  You have pain with breathing.  You have repeated fainting or seizure-like jerking during an episode.  You faint when sitting or lying down.  You have confusion.  You have trouble walking.  You have severe weakness.  You have vision problems. If you fainted,  call your local emergency services (911 in [place] Do not drive yourself to the hospital.  MAKE SURE YOU:  Understand these instructions.  Will watch your condition.  Will get help right away if you are not doing well or get worse. Document Released: 11/03/2005 Document Revised: 11/08/2013 Document Reviewed: 01/02/2012 [facility] Patient Information 2015 [place], [place]. This information is not intended to replace advice given to you by your health care provider. Make sure you discuss any questions you have with your health care provider.  Vertigo Vertigo means you feel like you or your surroundings are moving when they are not. Vertigo can be dangerous if it occurs when you are at work, driving, or performing difficult activities.  CAUSES  Vertigo occurs when there is a conflict of signals sent  to your brain from the visual and sensory systems in your body. There are many different causes of vertigo, including:  Infections, especially in the inner ear.  A bad reaction to a drug or misuse of alcohol and medicines.  Withdrawal from drugs or alcohol.  Rapidly changing positions, such as lying down or rolling over in bed.  A migraine headache.  Decreased blood flow to the brain.  Increased pressure in the brain from a head injury, infection, tumor, or bleeding. SYMPTOMS  You may feel as though the world is spinning around or you are falling to the ground. Because your balance is upset, vertigo can cause nausea and vomiting. You may have involuntary eye movements (nystagmus). DIAGNOSIS  Vertigo is usually diagnosed by physical exam. If the cause of your vertigo is unknown, your caregiver may perform imaging tests, such as an MRI scan (magnetic resonance imaging). TREATMENT  Most cases of vertigo resolve on their own, without treatment. Depending on the cause, your caregiver may prescribe certain medicines. If your vertigo is related to body position issues, your caregiver may recommend movements or procedures to correct the problem. In rare cases, if your vertigo is caused by certain inner ear problems, you may need surgery. HOME CARE INSTRUCTIONS   Follow your caregiver's instructions.  Avoid driving.  Avoid operating heavy machinery.  Avoid performing any tasks that would be dangerous to you or others during a vertigo episode.  Tell your caregiver if you notice that certain medicines seem to be causing your vertigo. Some of the medicines used to treat vertigo episodes can actually make them worse in some people. SEEK IMMEDIATE MEDICAL CARE IF:   Your medicines do not relieve your vertigo or are making it worse.  You develop problems with talking, walking, weakness, or using your arms, hands, or legs.  You develop severe headaches.  Your nausea or vomiting continues or  gets worse.  You develop visual changes.  A family member notices behavioral changes.  Your condition gets worse. MAKE SURE YOU:  Understand these instructions.  Will watch your condition.  Will get help right away if you are not doing well or get worse. Document Released: 08/13/2005 Document Revised: 01/26/2012 Document Reviewed: 05/22/2011 [facility] Patient Information 2015 [place], [place]. This information is not intended to replace advice given to you by your health care provider. Make sure you discuss any questions you have with your health care provider.

## 2014-08-20 NOTE — ED Provider Notes (Signed)
TIME SEEN: 1:19 AM  CHIEF COMPLAINT: Syncope, vomiting and diarrhea  HPI: Patient is a 73 year old female with history of depression, hyperlipidemia who presents to the emergency department with 2 syncopal events. Patient reports that she was feeling well earlier today was sitting in her living room watching TV and she stood up and felt very dizzy and passed out.  She reports that after she woke up she felt mildly short of breath but no chest pain or chest discomfort. She states she then felt very nauseous and vomited several times and had one episode of nonbloody diarrhea. She denies any current abdominal pain. She does have a mild headache and is not sure she hit her head. She is not on anticoagulation. No numbness, tingling or focal weakness. Patient denies melena, hematochezia. No vaginal bleeding. Denies a history of coronary artery disease, PE or DVT. No severe headache. Describes her dizziness as a vertigo and not a lightheadedness. No hearing loss, ear pain, tinnitus. Vertigo is not present with movement of her head but is present when standing upright.  ROS: See HPI Constitutional: no fever  Eyes: no drainage  ENT: no runny nose   Cardiovascular:  no chest pain  Resp: no SOB  GI: no vomiting GU: no dysuria Integumentary: no rash  Allergy: no hives  Musculoskeletal: no leg swelling  Neurological: no slurred speech ROS otherwise negative  PAST MEDICAL HISTORY/PAST SURGICAL HISTORY:  Past Medical History  Diagnosis Date  . Depression   . Rosacea   . Hyperlipemia     MEDICATIONS:  Prior to Admission medications   Medication Sig Start Date End Date Taking? Authorizing Provider  Cholecalciferol (VITAMIN D) 2000 UNITS CAPS Take 2,000 Units by mouth daily.    Yes Historical Provider, MD  Omega-3 Fatty Acids (FISH OIL) 1000 MG CAPS Take 2 by mouth daily   Yes Historical Provider, MD  vitamin C (ASCORBIC ACID) 500 MG tablet Take 500 mg by mouth daily.   Yes Historical Provider, MD     ALLERGIES:  Allergies  Allergen Reactions  . Alendronate Sodium     REACTION: depressed, headaches, stomach aches  . Atorvastatin     REACTION: u/k  . Morphine     REACTION: u/k    SOCIAL HISTORY:  History  Substance Use Topics  . Smoking status: Never Smoker   . Smokeless tobacco: Never Used  . Alcohol Use: No    FAMILY HISTORY: Family History  Problem Relation Age of Onset  . Heart disease Mother     MI  . Heart disease Father     CHF  . Cancer Sister 59    lung cancer    EXAM: BP 142/88  Pulse 78  Temp(Src) 98.1 F (36.7 C)  Resp 18  SpO2 99% CONSTITUTIONAL: Alert and oriented and responds appropriately to questions. Well-appearing; well-nourished HEAD: Normocephalic EYES: Conjunctivae clear, PERRL ENT: normal nose; no rhinorrhea; moist mucous membranes; pharynx without lesions noted NECK: Supple, no meningismus, no LAD  CARD: RRR; S1 and S2 appreciated; no murmurs, no clicks, no rubs, no gallops RESP: Normal chest excursion without splinting or tachypnea; breath sounds clear and equal bilaterally; no wheezes, no rhonchi, no rales,  ABD/GI: Normal bowel sounds; non-distended; soft, non-tender, no rebound, no guarding BACK:  The back appears normal and is non-tender to palpation, there is no CVA tenderness EXT: Normal ROM in all joints; non-tender to palpation; no edema; normal capillary refill; no cyanosis    SKIN: Normal color for age and race; warm  NEURO: Moves all extremities equally; cranial nerves II through XII intact, sensation to light touch intact diffusely, no nystagmus PSYCH: The patient's mood and manner are appropriate. Grooming and personal hygiene are appropriate.  MEDICAL DECISION MAKING: Patient here with syncopal event x2 with standing. She did have mild shortness of breath, vomiting and diarrhea afterwards. She currently has no symptoms is neurologically intact, hemodynamically stable. Labs ordered in triage are unremarkable. Troponin  negative. Will obtain orthostatic vital signs, abdominal series to evaluate for possible obstruction and evaluate her cardiac silhouette; CT head and cervical spine to rule out traumatic injury. We'll give IV fluids, Zofran and reassess.  ED PROGRESS: Patient reports feeling better after IV fluid and Zofran. Her labs are unremarkable. Negative troponin. CT head and cervical spine show no acute injury. Patient is not orthostatic. Abdominal series negative. Have offered admission for syncopal workup although symptoms very atypical and patient is otherwise healthy. She states she better be discharged home with close outpatient followup. Will repeat second troponin.   Second troponin negative. She has not had any arrhythmia, cardiac event or episode of vertigo or vomiting in the ED. She has tolerated by mouth without difficulty. Urine shows leukocytes but also squamous cells. Suspect a dirty catch. Discussed with patient strict return precautions. Have advised her to followup with her primary care physician. Have advised her to drink plenty of fluids, when standing up to do so slowly and stand in one place for a period of time before walking. Patient and her daughters at bedside are comfortable with this plan.       Date: 08/19/2014  Rate: 69  Rhythm: normal sinus rhythm  QRS Axis: normal  Intervals: normal  ST/T Wave abnormalities: normal  Conduction Disutrbances: none  Narrative Interpretation: unremarkable      [place], DO 08/20/14 [id number]

## 2014-08-20 NOTE — ED Notes (Signed)
Pt taken back to CT.

## 2014-08-25 ENCOUNTER — Emergency Department (HOSPITAL_COMMUNITY)
Admission: EM | Admit: 2014-08-25 | Discharge: 2014-08-25 | Disposition: A | Discharge status: Home / Self Care | Payer: Medicare Other | Attending: Emergency Medicine | Admitting: Emergency Medicine

## 2014-08-25 ENCOUNTER — Emergency Department (HOSPITAL_COMMUNITY): Payer: Medicare Other

## 2014-08-25 ENCOUNTER — Ambulatory Visit: Payer: Medicare Other | Admitting: Family Medicine

## 2014-08-25 ENCOUNTER — Encounter (HOSPITAL_COMMUNITY): Payer: Self-pay | Admitting: Emergency Medicine

## 2014-08-25 DIAGNOSIS — Z8659 Personal history of other mental and behavioral disorders: Secondary | ICD-10-CM | POA: Insufficient documentation

## 2014-08-25 DIAGNOSIS — Z872 Personal history of diseases of the skin and subcutaneous tissue: Secondary | ICD-10-CM | POA: Insufficient documentation

## 2014-08-25 DIAGNOSIS — R112 Nausea with vomiting, unspecified: Secondary | ICD-10-CM | POA: Diagnosis not present

## 2014-08-25 DIAGNOSIS — Z79899 Other long term (current) drug therapy: Secondary | ICD-10-CM | POA: Insufficient documentation

## 2014-08-25 DIAGNOSIS — E785 Hyperlipidemia, unspecified: Secondary | ICD-10-CM | POA: Insufficient documentation

## 2014-08-25 DIAGNOSIS — R42 Dizziness and giddiness: Secondary | ICD-10-CM | POA: Insufficient documentation

## 2014-08-25 LAB — CBC WITH DIFFERENTIAL/PLATELET
Basophils Absolute: 0.1 10*3/uL (ref 0.0–0.1)
Basophils Relative: 1 % (ref 0–1)
EOS ABS: 0.1 10*3/uL (ref 0.0–0.7)
EOS PCT: 1 % (ref 0–5)
HCT: 36.4 % (ref 36.0–46.0)
HEMOGLOBIN: 12.5 g/dL (ref 12.0–15.0)
LYMPHS ABS: 1.7 10*3/uL (ref 0.7–4.0)
Lymphocytes Relative: 17 % (ref 12–46)
MCH: 31 pg (ref 26.0–34.0)
MCHC: 34.3 g/dL (ref 30.0–36.0)
MCV: 90.3 fL (ref 78.0–100.0)
Monocytes Absolute: 0.5 10*3/uL (ref 0.1–1.0)
Monocytes Relative: 5 % (ref 3–12)
NEUTROS ABS: 7.5 10*3/uL (ref 1.7–7.7)
Neutrophils Relative %: 76 % (ref 43–77)
Platelets: 289 10*3/uL (ref 150–400)
RBC: 4.03 MIL/uL (ref 3.87–5.11)
RDW: 13.7 % (ref 11.5–15.5)
WBC: 9.8 10*3/uL (ref 4.0–10.5)

## 2014-08-25 LAB — BASIC METABOLIC PANEL
ANION GAP: 14 (ref 5–15)
BUN: 14 mg/dL (ref 6–23)
CALCIUM: 9.3 mg/dL (ref 8.4–10.5)
CO2: 24 mEq/L (ref 19–32)
Chloride: 101 mEq/L (ref 96–112)
Creatinine, Ser: 0.79 mg/dL (ref 0.50–1.10)
GFR calc Af Amer: >90 mL/min (ref >90)
GFR calc non Af Amer: 81 mL/min — ABNORMAL LOW (ref >90)
GLUCOSE: 126 mg/dL — AB (ref 70–99)
Potassium: 4 mEq/L (ref 3.7–5.3)
SODIUM: 139 meq/L (ref 137–147)

## 2014-08-25 MED ORDER — SODIUM CHLORIDE 0.9 % IV BOLUS (SEPSIS)
1000.0000 mL | Freq: Once | INTRAVENOUS | Status: AC
  Administered 2014-08-25: 1000 mL via INTRAVENOUS

## 2014-08-25 MED ORDER — DIAZEPAM 5 MG/ML IJ SOLN
5.0000 mg | Freq: Once | INTRAMUSCULAR | Status: AC
  Administered 2014-08-25: 5 mg via INTRAVENOUS
  Filled 2014-08-25: qty 2

## 2014-08-25 NOTE — ED Notes (Signed)
Pt to MRI

## 2014-08-25 NOTE — ED Provider Notes (Signed)
CSN: [id number]     Arrival date & time 08/25/14  1847 History   First MD Initiated Contact with Patient 08/25/14 1849     Chief Complaint  Patient presents with  . Dizziness     (Consider location/radiation/quality/duration/timing/severity/associated sxs/prior Treatment) HPI Comments: Patient is a 73 year old female with little medical history. She presents today with complaints of dizziness, nausea, and vomiting which started abruptly approximately one hour ago. She had a similar episode 5 days ago and was evaluated here. According to the patient, all of her tests looked okay and she was not given a definite diagnosis. She denies headache. She denies fever or chills. She denies having any trauma or having struck her head. She does report some ringing in the ears but no hearing loss.  Patient is a 73 y.o. female presenting with dizziness. The history is provided by the patient.  Dizziness Quality:  Head spinning Severity:  Severe Onset quality:  Sudden Duration:  1 hour Timing:  Constant Progression:  Waxing and waning Chronicity:  New Context: bending over   Relieved by:  Nothing Worsened by:  Movement, sitting upright, standing up and turning head   Past Medical History  Diagnosis Date  . Depression   . Rosacea   . Hyperlipemia    Past Surgical History  Procedure Laterality Date  . Partial hysterectomy      secondary to abn pap  . Hemorrhoid surgery    . Patient refuses      mammograms, chol treatment and vaccines   Family History  Problem Relation Age of Onset  . Heart disease Mother     MI  . Heart disease Father     CHF  . Cancer Sister 23    lung cancer   History  Substance Use Topics  . Smoking status: Never Smoker   . Smokeless tobacco: Never Used  . Alcohol Use: No   OB History   Grav Para Term Preterm Abortions TAB SAB Ect Mult Living                 Review of Systems  Neurological: Positive for dizziness.  All other systems reviewed and are  negative.     Allergies  Alendronate sodium; Atorvastatin; and Morphine  Home Medications   Prior to Admission medications   Medication Sig Start Date End Date Taking? Authorizing Provider  Cholecalciferol (VITAMIN D) 2000 UNITS CAPS Take 2,000 Units by mouth daily.     Historical Provider, MD  meclizine (ANTIVERT) 50 MG tablet Take 1 tablet (50 mg total) by mouth 3 (three) times daily as needed for dizziness. 08/20/14   Kristen N Ward, DO  Omega-3 Fatty Acids (FISH OIL) 1000 MG CAPS Take 2 by mouth daily    Historical Provider, MD  ondansetron (ZOFRAN ODT) 4 MG disintegrating tablet Take 1 tablet (4 mg total) by mouth every 8 (eight) hours as needed for nausea or vomiting. 08/20/14   Kristen N Ward, DO  vitamin C (ASCORBIC ACID) 500 MG tablet Take 500 mg by mouth daily.    Historical Provider, MD   BP 145/69  Pulse 71  Temp(Src) 97.8 F (36.6 C) (Oral)  Resp 14  SpO2 97% Physical Exam  Nursing note and vitals reviewed. Constitutional: She is oriented to person, place, and time. She appears well-developed and well-nourished. No distress.  HENT:  Head: Normocephalic and atraumatic.  Eyes: EOM are normal. Pupils are equal, round, and reactive to light.  Neck: Normal range of motion. Neck supple.  Cardiovascular: Normal rate and regular rhythm.  Exam reveals no gallop and no friction rub.   No murmur heard. Pulmonary/Chest: Effort normal and breath sounds normal. No respiratory distress. She has no wheezes.  Abdominal: Soft. Bowel sounds are normal. She exhibits no distension. There is no tenderness.  Musculoskeletal: Normal range of motion.  Neurological: She is alert and oriented to person, place, and time. No cranial nerve deficit. She exhibits normal muscle tone. Coordination normal.  Skin: Skin is warm and dry. She is not diaphoretic.    ED Course  Procedures (including critical care time) Labs Review Labs Reviewed  CBC WITH DIFFERENTIAL  BASIC METABOLIC PANEL     Imaging Review No results found.   EKG Interpretation   Date/Time:  Friday August 25 2014 19:00:16 EDT Ventricular Rate:  71 PR Interval:  226 QRS Duration: 79 QT Interval:  441 QTC Calculation: 479 R Axis:   36 Text Interpretation:  Sinus rhythm Prolonged PR interval No priors for  comparison Confirmed by Beau Fanny  MD, Ashli Selders ([id number]) on 08/25/2014 8:59:53 PM      MDM   Final diagnoses:  None    Patient is a 73 year old female who presents for evaluation of severe dizziness, nausea, and vomiting. This started acutely approximately one hour prior to arrival. She was seen for similar symptoms 4 days ago, however no cause was found.  Clinically, this appears to be a peripheral vertigo. She was given IV fluids and Valium and appears to be feeling better. An MRI reveals no evidence for acute stroke or other abnormality. I feel as though she is appropriate for discharge. I will recommend meclizine as needed and when necessary return.    Veryl Speak, MD 08/25/14 2224

## 2014-08-25 NOTE — ED Notes (Signed)
Pt to ED with c/o dizziness.  Pt was see here on 10/4 for same symptoms was given Rx's  Antivert and Zofran ODT  But did not take today when symptoms started.   Pt gave Zofran 4mg  IV PTA  With relief from nausea

## 2014-08-25 NOTE — ED Notes (Signed)
Per Dr. Stark Jock, pt to be discharged after receiving liter of fluid.

## 2014-08-25 NOTE — Discharge Instructions (Signed)
Continue taking meclizine 25 mg every 6 hours as needed for dizziness.  Return to the emergency department for severe headache, chest pain, or other new and concerning symptoms.   Benign Positional Vertigo Vertigo means you feel like you or your surroundings are moving when they are not. Benign positional vertigo is the most common form of vertigo. Benign means that the cause of your condition is not serious. Benign positional vertigo is more common in older adults. CAUSES  Benign positional vertigo is the result of an upset in the labyrinth system. This is an area in the middle ear that helps control your balance. This may be caused by a viral infection, head injury, or repetitive motion. However, often no specific cause is found. SYMPTOMS  Symptoms of benign positional vertigo occur when you move your head or eyes in different directions. Some of the symptoms may include:  Loss of balance and falls.  Vomiting.  Blurred vision.  Dizziness.  Nausea.  Involuntary eye movements (nystagmus). DIAGNOSIS  Benign positional vertigo is usually diagnosed by physical exam. If the specific cause of your benign positional vertigo is unknown, your caregiver may perform imaging tests, such as magnetic resonance imaging (MRI) or computed tomography (CT). TREATMENT  Your caregiver may recommend movements or procedures to correct the benign positional vertigo. Medicines such as meclizine, benzodiazepines, and medicines for nausea may be used to treat your symptoms. In rare cases, if your symptoms are caused by certain conditions that affect the inner ear, you may need surgery. HOME CARE INSTRUCTIONS   Follow your caregiver's instructions.  Move slowly. Do not make sudden body or head movements.  Avoid driving.  Avoid operating heavy machinery.  Avoid performing any tasks that would be dangerous to you or others during a vertigo episode.  Drink enough fluids to keep your urine clear or pale  yellow. SEEK IMMEDIATE MEDICAL CARE IF:   You develop problems with walking, weakness, numbness, or using your arms, hands, or legs.  You have difficulty speaking.  You develop severe headaches.  Your nausea or vomiting continues or gets worse.  You develop visual changes.  Your family or friends notice any behavioral changes.  Your condition gets worse.  You have a fever.  You develop a stiff neck or sensitivity to light. MAKE SURE YOU:   Understand these instructions.  Will watch your condition.  Will get help right away if you are not doing well or get worse. Document Released: 08/11/2006 Document Revised: 01/26/2012 Document Reviewed: 07/24/2011 [facility] Patient Information 2015 [place], [place]. This information is not intended to replace advice given to you by your health care provider. Make sure you discuss any questions you have with your health care provider.

## 2014-08-28 ENCOUNTER — Encounter: Payer: Self-pay | Admitting: Family Medicine

## 2014-08-28 ENCOUNTER — Ambulatory Visit (INDEPENDENT_AMBULATORY_CARE_PROVIDER_SITE_OTHER): Payer: Medicare Other | Admitting: Family Medicine

## 2014-08-28 VITALS — BP 140/86 | HR 66 | Temp 98.2°F | Ht 63.25 in | Wt 146.2 lb

## 2014-08-28 DIAGNOSIS — H8113 Benign paroxysmal vertigo, bilateral: Secondary | ICD-10-CM

## 2014-08-28 DIAGNOSIS — H8110 Benign paroxysmal vertigo, unspecified ear: Secondary | ICD-10-CM | POA: Insufficient documentation

## 2014-08-28 MED ORDER — MECLIZINE HCL 25 MG PO TABS: 25.0000 mg | ORAL_TABLET | Freq: Three times a day (TID) | ORAL | Status: DC | PRN

## 2014-08-28 NOTE — Assessment & Plan Note (Signed)
Pt had 2 ER visits-one req EMS transport  Rev records - imp with IV valium/ also MRI reviewed (negative) Suspect it may have been viral in origin  Pt doing well /tolerating meclizine 25 mg tid  Will gradually wean this over the next week (if she is symptom free) Reassuring exam today  If this reoccurs or if symptoms worsen-would consider ENT/ PT for this

## 2014-08-28 NOTE — Patient Instructions (Signed)
Continue meclizine 1 pill three times daily for the next 2-3 days - then slowly wean it down and see how you do If dizziness worsens please let me know  Continue to change position slowly      Benign Positional Vertigo Vertigo means you feel like you or your surroundings are moving when they are not. Benign positional vertigo is the most common form of vertigo. Benign means that the cause of your condition is not serious. Benign positional vertigo is more common in older adults. CAUSES  Benign positional vertigo is the result of an upset in the labyrinth system. This is an area in the middle ear that helps control your balance. This may be caused by a viral infection, head injury, or repetitive motion. However, often no specific cause is found. SYMPTOMS  Symptoms of benign positional vertigo occur when you move your head or eyes in different directions. Some of the symptoms may include:  Loss of balance and falls.  Vomiting.  Blurred vision.  Dizziness.  Nausea.  Involuntary eye movements (nystagmus). DIAGNOSIS  Benign positional vertigo is usually diagnosed by physical exam. If the specific cause of your benign positional vertigo is unknown, your caregiver may perform imaging tests, such as magnetic resonance imaging (MRI) or computed tomography (CT). TREATMENT  Your caregiver may recommend movements or procedures to correct the benign positional vertigo. Medicines such as meclizine, benzodiazepines, and medicines for nausea may be used to treat your symptoms. In rare cases, if your symptoms are caused by certain conditions that affect the inner ear, you may need surgery. HOME CARE INSTRUCTIONS   Follow your caregiver's instructions.  Move slowly. Do not make sudden body or head movements.  Avoid driving.  Avoid operating heavy machinery.  Avoid performing any tasks that would be dangerous to you or others during a vertigo episode.  Drink enough fluids to keep your urine  clear or pale yellow. SEEK IMMEDIATE MEDICAL CARE IF:   You develop problems with walking, weakness, numbness, or using your arms, hands, or legs.  You have difficulty speaking.  You develop severe headaches.  Your nausea or vomiting continues or gets worse.  You develop visual changes.  Your family or friends notice any behavioral changes.  Your condition gets worse.  You have a fever.  You develop a stiff neck or sensitivity to light. MAKE SURE YOU:   Understand these instructions.  Will watch your condition.  Will get help right away if you are not doing well or get worse. Document Released: 08/11/2006 Document Revised: 01/26/2012 Document Reviewed: 07/24/2011 [facility] Patient Information 2015 [place], [place]. This information is not intended to replace advice given to you by your health care provider. Make sure you discuss any questions you have with your health care provider.

## 2014-08-28 NOTE — Progress Notes (Signed)
Pre visit review using our clinic review tool, if applicable. No additional management support is needed unless otherwise documented below in the visit note. 

## 2014-08-28 NOTE — Progress Notes (Signed)
Subjective:    Patient ID: Connie Bell, female    DOB: [birth year], 73 y.o.   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 Pre-employment examination 08/26/2013  . Yeast vaginitis 11/24/2012  . Dysuria 11/24/2012  . Bronchitis 10/28/2011  . Hemorrhoids 10/28/2011  . HEMORRHOIDS, INTERNAL 01/08/2011  . HYPERCHOLESTEROLEMIA 01/12/2008  . DEPRESSION 01/12/2008  . ROSACEA 01/12/2008   Past Medical History  Diagnosis Date  . Depression   . Rosacea   . Hyperlipemia    Past Surgical History  Procedure Laterality Date  . Partial hysterectomy      secondary to abn pap  . Hemorrhoid surgery    . Patient refuses      mammograms, chol treatment and vaccines   History  Substance Use Topics  . Smoking status: Never Smoker   . Smokeless tobacco: Never Used  . Alcohol Use: No   Family History  Problem Relation Age of Onset  . Heart disease Mother     MI  . Heart disease Father     CHF  . Cancer Sister 19    lung cancer   Allergies  Allergen Reactions  . Alendronate Sodium     REACTION: depressed, headaches, stomach aches  . Atorvastatin     REACTION: u/k  . Morphine     REACTION: u/k   Current Outpatient Prescriptions on File Prior to Visit  Medication Sig Dispense Refill  . Cholecalciferol (VITAMIN D) 2000 UNITS CAPS Take 2,000 Units by mouth  daily.       . Omega-3 Fatty Acids (FISH OIL) 1000 MG CAPS Take 2 by mouth daily      . ondansetron (ZOFRAN-ODT) 4 MG disintegrating tablet Take 4 mg by mouth every 8 (eight) hours as needed for nausea or vomiting.      . vitamin C (ASCORBIC ACID) 500 MG tablet Take 500 mg by mouth daily.       No current facility-administered medications on file prior to visit.     Review of Systems  Constitutional: Negative for fever, activity change, appetite change, fatigue and unexpected weight change.  HENT: Positive for postnasal drip. Negative for congestion, ear pain, rhinorrhea, sinus pressure and sore throat.   Eyes: Negative for pain, redness and visual disturbance.  Respiratory: Negative for cough, shortness of breath and wheezing.   Cardiovascular: Negative for chest pain and palpitations.  Gastrointestinal: Negative for abdominal pain, diarrhea, constipation and blood in stool.  Endocrine: Negative for polydipsia and polyuria.  Genitourinary: Negative for dysuria, urgency and frequency.  Musculoskeletal: Negative for arthralgias, back pain and myalgias.  Skin: Negative for pallor and rash.  Allergic/Immunologic: Negative for environmental allergies.  Neurological: Positive  for dizziness. Negative for tremors, seizures, syncope, speech difficulty, weakness, numbness and headaches.  Hematological: Negative for adenopathy. Does not bruise/bleed easily.  Psychiatric/Behavioral: Negative for dysphoric mood and decreased concentration. The patient is not nervous/anxious.        Objective:   Physical Exam  Constitutional: She appears well-developed and well-nourished. No distress.  HENT:  Head: Normocephalic and atraumatic.  Right Ear: External ear normal.  Left Ear: External ear normal.  Mouth/Throat: Oropharynx is clear and moist. No oropharyngeal exudate.  Nares are boggy but clear   Eyes: Conjunctivae and EOM are normal. Pupils are equal, round, and reactive to light. Right eye exhibits  no discharge. Left eye exhibits no discharge. No scleral icterus.  3-4 beats of horizontal nystagmus bilaterally  Neck: Normal range of motion. Neck supple. No JVD present. Carotid bruit is not present. No thyromegaly present.  Cardiovascular: Normal rate, regular rhythm, normal heart sounds and intact distal pulses.  Exam reveals no gallop.   No murmur heard. Pulmonary/Chest: Effort normal and breath sounds normal. No respiratory distress. She has no wheezes. She has no rales.  Abdominal: Soft. Bowel sounds are normal. She exhibits no distension and no mass. There is no tenderness.  Musculoskeletal: She exhibits no edema.  Lymphadenopathy:    She has no cervical adenopathy.  Neurological: She is alert. She has normal reflexes. She displays no atrophy and no tremor. No cranial nerve deficit or sensory deficit. She exhibits normal muscle tone. Coordination and gait normal.  No focal cerebellar signs   Skin: Skin is warm and dry. No rash noted. No erythema. No pallor.  Brisk capillary refill  Psychiatric: She has a normal mood and affect.          Assessment & Plan:   Problem List Items Addressed This Visit     Nervous and Auditory   Benign paroxysmal positional vertigo - Primary     Pt had 2 ER visits-one req EMS transport  Rev records - imp with IV valium/ also MRI reviewed (negative) Suspect it may have been viral in origin  Pt doing well /tolerating meclizine 25 mg tid  Will gradually wean this over the next week (if she is symptom free) Reassuring exam today  If this reoccurs or if symptoms worsen-would consider ENT/ PT for this

## 2014-09-07 ENCOUNTER — Ambulatory Visit (INDEPENDENT_AMBULATORY_CARE_PROVIDER_SITE_OTHER): Payer: Medicare Other | Admitting: Family Medicine

## 2014-09-07 ENCOUNTER — Encounter: Payer: Self-pay | Admitting: Family Medicine

## 2014-09-07 VITALS — BP 130/78 | HR 85 | Temp 98.3°F | Wt 146.0 lb

## 2014-09-07 DIAGNOSIS — R059 Cough, unspecified: Secondary | ICD-10-CM

## 2014-09-07 DIAGNOSIS — R05 Cough: Secondary | ICD-10-CM

## 2014-09-07 MED ORDER — BENZONATATE 200 MG PO CAPS: 200.0000 mg | ORAL_CAPSULE | Freq: Three times a day (TID) | ORAL | Status: DC | PRN

## 2014-09-07 NOTE — Patient Instructions (Signed)
Tessalon for the cough. Drink plenty of fluids, take tylenol as needed, and gargle with warm salt water for your throat if needed.  This should gradually improve.  Take care.  Let us know if you have other concerns.

## 2014-09-07 NOTE — Progress Notes (Signed)
Pre visit review using our clinic review tool, if applicable. No additional management support is needed unless otherwise documented below in the visit note.  Recently with vertigo dx.  In the meantime she had cough.  Here for eval. Oncologist, [place].  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  Sx started about 2 days ago.  She felt worse yesterday than today.  No FCNAV.  Some diarrhea. No sputum.  No ear pain.  Rhinorrhea.  Stuffy nose.  Some ST, better now.  Lost voice.  Out of work today.  No rash.    Meds, vitals, and allergies reviewed.   ROS: See HPI.  Otherwise, noncontributory.  GEN: nad, alert and oriented HEENT: mucous membranes moist, tm w/o erythema, nasal exam w/o erythema, clear discharge noted,  OP with cobblestoning NECK: supple w/o LA CV: rrr.   PULM: ctab, no inc wob EXT: no edema SKIN: no acute rash

## 2014-09-08 DIAGNOSIS — R05 Cough: Secondary | ICD-10-CM | POA: Insufficient documentation

## 2014-09-08 DIAGNOSIS — R059 Cough, unspecified: Secondary | ICD-10-CM | POA: Insufficient documentation

## 2014-09-08 NOTE — Assessment & Plan Note (Signed)
Likely viral, tessalon for cough, nontoxic. D/w pt.  She agrees.

## 2014-09-09 ENCOUNTER — Telehealth: Payer: Self-pay

## 2014-09-09 NOTE — Telephone Encounter (Signed)
Spoke to pt and pt refuses to come in for an AWV.

## 2015-06-13 ENCOUNTER — Encounter: Payer: Self-pay | Admitting: Family Medicine

## 2015-11-05 IMAGING — CR DG ABDOMEN ACUTE W/ 1V CHEST
3 series · 3 of 3 positions shown · non-contrast
Comparison: Chest radiograph 01/10/2008.

CLINICAL DATA: Abdominal pain.  Syncope.  Nausea and vomiting.

EXAM:
ACUTE ABDOMEN SERIES (ABDOMEN 2 VIEW & CHEST 1 VIEW)

[w chest pa]
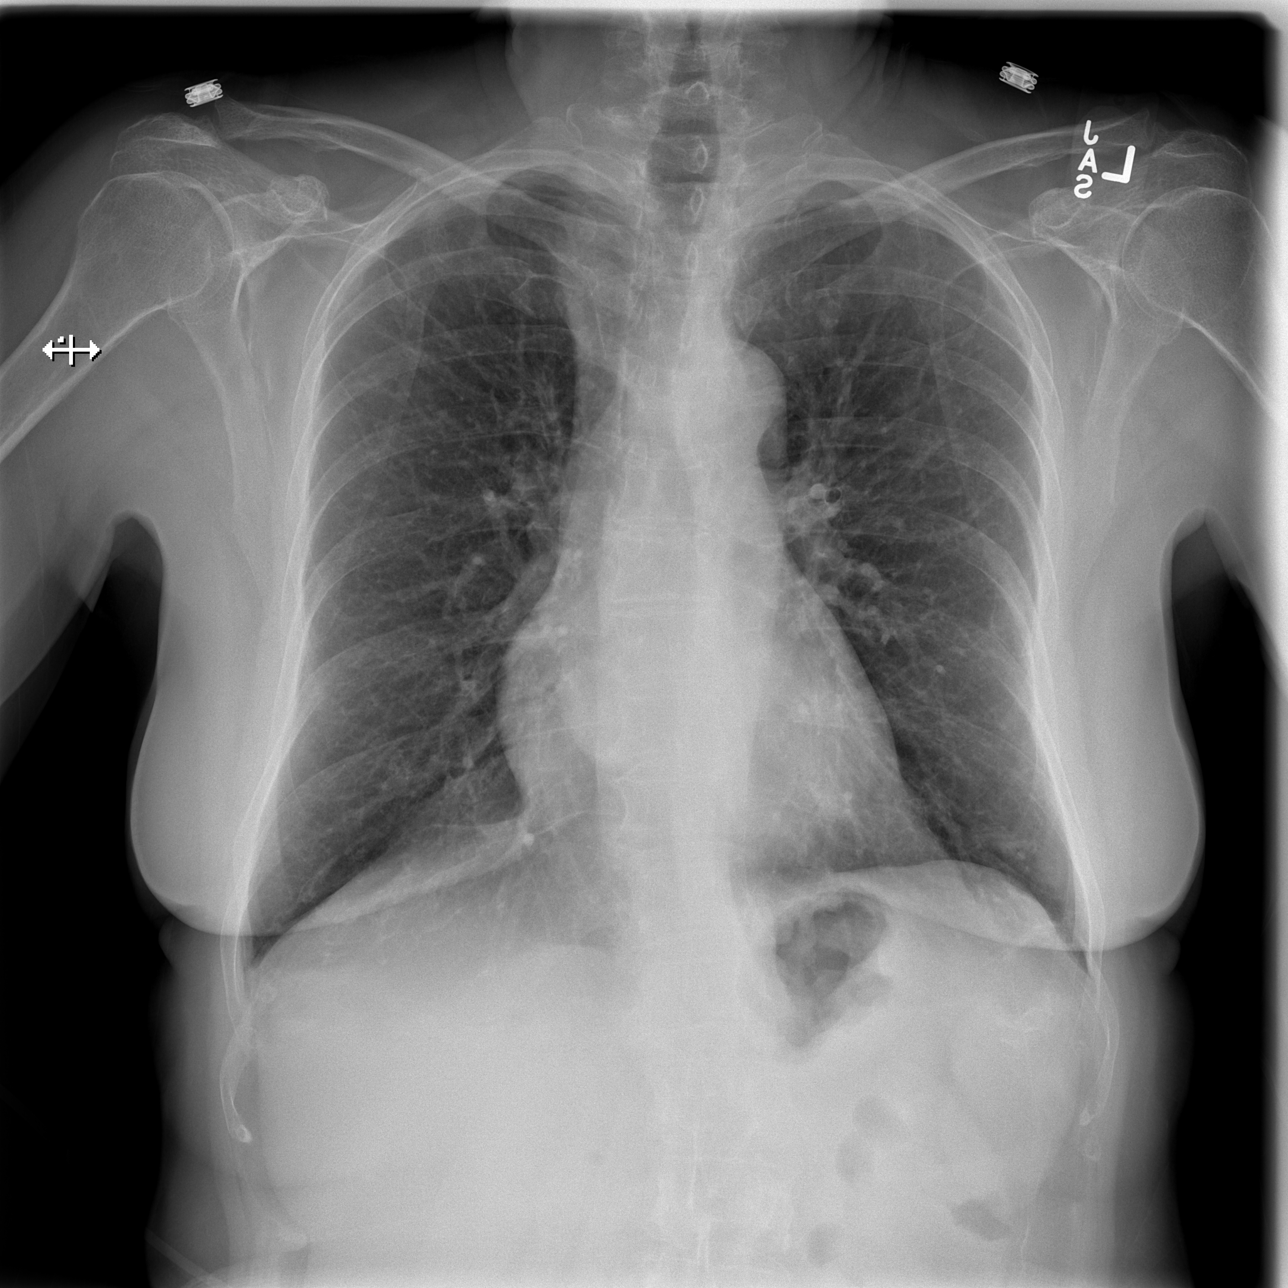

[w abdomen upright]
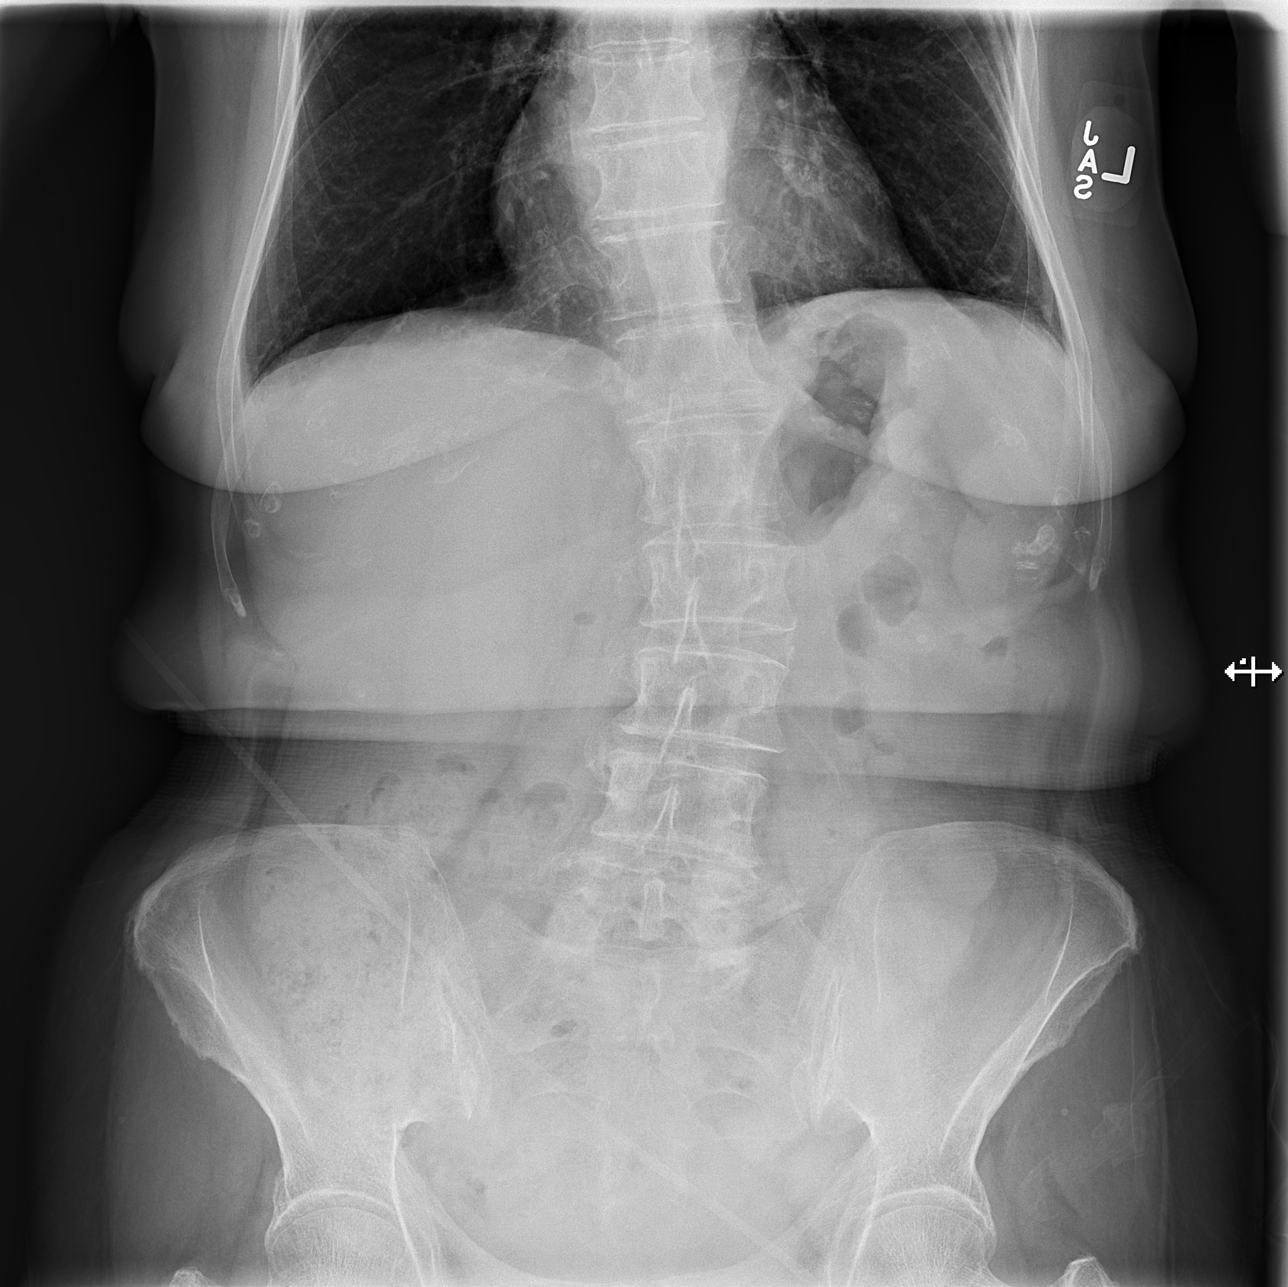

[t abdomen supine]
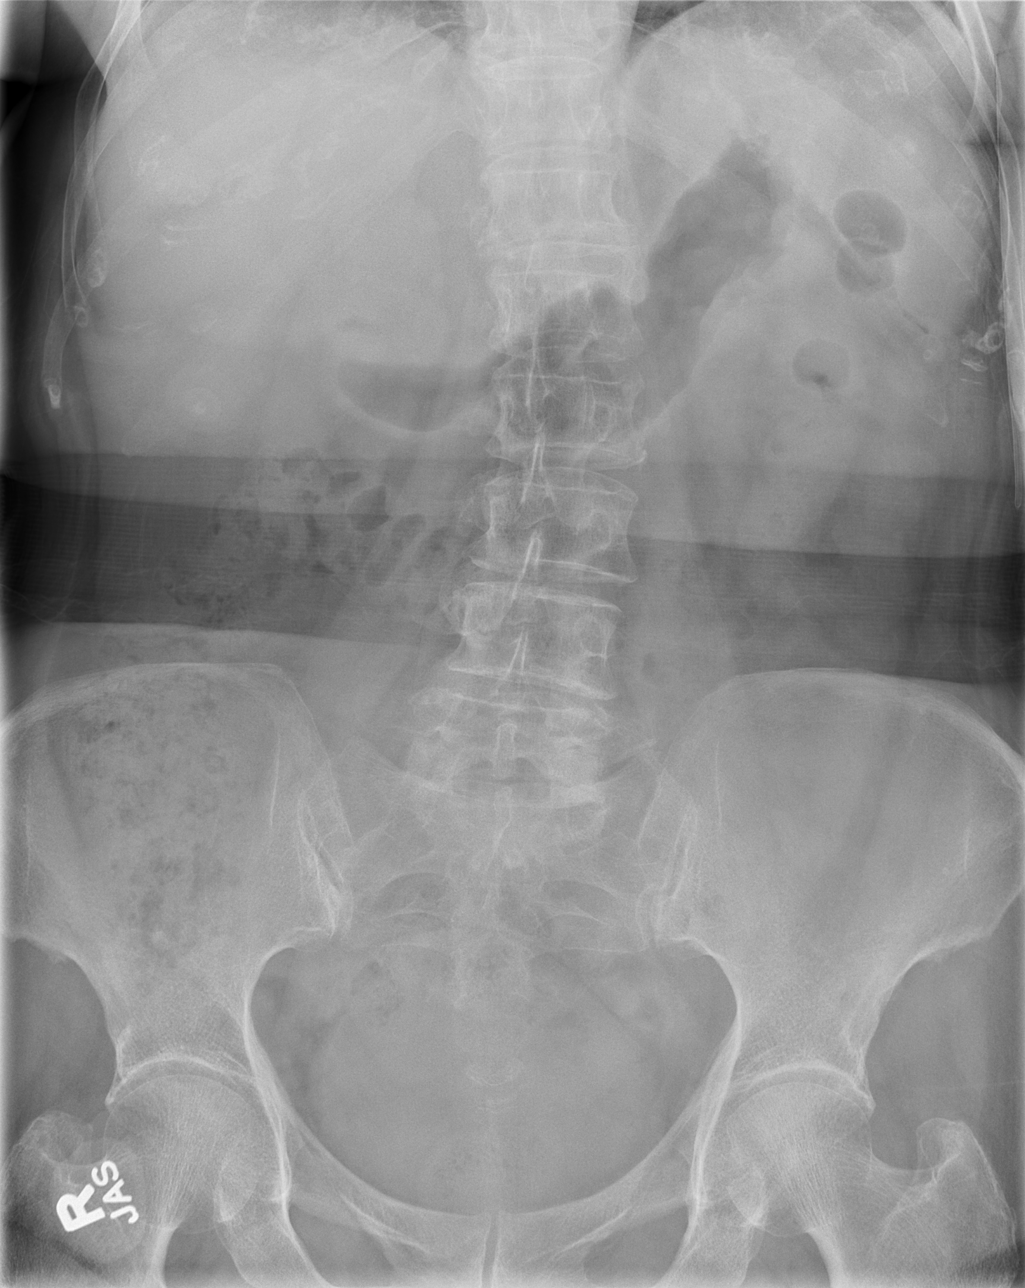

[3 of 3 positions shown; findings below may reference images not displayed]

FINDINGS: Stable cardiac and mediastinal contours with tortuosity of the
thoracic aorta. Lungs are hyperexpanded. No large consolidative
pulmonary opacities. No pleural effusion or pneumothorax.

Gas is demonstrated within nondilated loops of large and small bowel
in a nonobstructive pattern. Stool is present in the cecum and
ascending colon. No free intraperitoneal air. Mild leftward
curvature of the lumbar spine. Lower lumbar spine degenerative
changes.
IMPRESSION: No acute cardiopulmonary process.

No evidence for bowel obstruction.

## 2016-02-28 ENCOUNTER — Other Ambulatory Visit: Payer: Self-pay

## 2016-02-28 DIAGNOSIS — Z1231 Encounter for screening mammogram for malignant neoplasm of breast: Secondary | ICD-10-CM

## 2016-03-25 ENCOUNTER — Ambulatory Visit
Admission: RE | Admit: 2016-03-25 | Discharge: 2016-03-25 | Disposition: A | Discharge status: Home / Self Care | Payer: Medicare Other | Source: Ambulatory Visit

## 2016-03-25 DIAGNOSIS — Z1231 Encounter for screening mammogram for malignant neoplasm of breast: Secondary | ICD-10-CM | POA: Diagnosis not present

## 2016-03-25 LAB — HM MAMMOGRAPHY: HM MAMMO: NORMAL (ref 0–4)

## 2016-03-26 ENCOUNTER — Encounter: Payer: Self-pay | Admitting: *Deleted

## 2016-04-24 ENCOUNTER — Telehealth: Payer: Self-pay | Admitting: Family Medicine

## 2016-04-24 NOTE — Telephone Encounter (Signed)
Called pt to schedule awv w/lisa  Left message

## 2016-04-24 NOTE — Telephone Encounter (Signed)
Patient called and said she'd call back to schedule appointment.

## 2016-06-05 DIAGNOSIS — H2513 Age-related nuclear cataract, bilateral: Secondary | ICD-10-CM | POA: Diagnosis not present

## 2016-06-05 DIAGNOSIS — Z0100 Encounter for examination of eyes and vision without abnormal findings: Secondary | ICD-10-CM | POA: Diagnosis not present

## 2016-07-14 ENCOUNTER — Encounter: Payer: Self-pay | Admitting: Family Medicine

## 2016-07-14 ENCOUNTER — Ambulatory Visit (INDEPENDENT_AMBULATORY_CARE_PROVIDER_SITE_OTHER): Payer: Medicare Other | Admitting: Family Medicine

## 2016-07-14 VITALS — BP 118/76 | HR 77 | Temp 98.4°F | Ht 63.25 in | Wt 155.8 lb

## 2016-07-14 DIAGNOSIS — Z800 Family history of malignant neoplasm of digestive organs: Secondary | ICD-10-CM | POA: Diagnosis not present

## 2016-07-14 DIAGNOSIS — Z1211 Encounter for screening for malignant neoplasm of colon: Secondary | ICD-10-CM | POA: Diagnosis not present

## 2016-07-14 DIAGNOSIS — Z23 Encounter for immunization: Secondary | ICD-10-CM | POA: Diagnosis not present

## 2016-07-14 NOTE — Progress Notes (Signed)
Pre visit review using our clinic review tool, if applicable. No additional management support is needed unless otherwise documented below in the visit note. 

## 2016-07-14 NOTE — Assessment & Plan Note (Signed)
Disc pros/cons and risks of colon cancer risk for pt with new fam hx of colon cancer (brother in his 45s) and also a sister with polyps She declines colonoscopy Agrees to cologuard program- and agrees to get a colonoscopy for a positive result No symptoms Signed her up for the program

## 2016-07-14 NOTE — Progress Notes (Signed)
Subjective:    Patient ID: Connie Bell, female    DOB: [birth year], 75 y.o.   MRN: [id number]  HPI Here to discuss colon cancer screening    Had a mammogram in May- and it was negative     Wt Readings from Last 3 Encounters:  07/14/16 155 lb 12 oz (70.6 kg)  09/07/14 146 lb (66.2 kg)  08/28/14 146 lb 4 oz (66.3 kg)  bmi is 27.3  Family history  Brother dx with colon cancer recently  Sister with polyps   She declines a colonoscopy- even understanding her increased risks due to family history  She would be open to cologuard She would do colonoscopy if the test was pos   She has had hemorrhoid surgery in the past herself   Will get a flu shot   Patient Active Problem List   Diagnosis Date Noted  . Colon cancer screening 07/14/2016  . Family history of colon cancer 07/14/2016  . Cough 09/08/2014  . Benign paroxysmal positional vertigo 08/28/2014  . Pre-employment examination 08/26/2013  . Hemorrhoids 10/28/2011  . HEMORRHOIDS, INTERNAL 01/08/2011  . HYPERCHOLESTEROLEMIA 01/12/2008  . DEPRESSION 01/12/2008  . ROSACEA 01/12/2008   Past Medical History:  Diagnosis Date  . Depression   . Hyperlipemia   . Rosacea    Past Surgical History:  Procedure Laterality Date  . HEMORRHOID SURGERY    . PARTIAL HYSTERECTOMY     secondary to abn pap  . Patient refuses     mammograms, chol treatment and vaccines   Social History  Substance Use Topics  . Smoking status: Never Smoker  . Smokeless tobacco: Never Used  . Alcohol use No   Family History  Problem Relation Age of Onset  . Heart disease Mother     MI  . Cancer Sister 2    lung cancer  . Colon cancer Brother   . Colon polyps Sister   . Heart disease Father     CHF   Allergies  Allergen Reactions  . Alendronate Sodium     REACTION: depressed, headaches, stomach aches  . Atorvastatin     REACTION: u/k  . Morphine     REACTION: u/k   Current Outpatient Prescriptions on File Prior to Visit    Medication Sig Dispense Refill  . Cholecalciferol (VITAMIN D) 2000 UNITS CAPS Take 2,000 Units by mouth daily.     . Omega-3 Fatty Acids (FISH OIL) 1000 MG CAPS Take 2 by mouth daily     No current facility-administered medications on file prior to visit.     Review of Systems    Review of Systems  Constitutional: Negative for fever, appetite change, fatigue and unexpected weight change.  Eyes: Negative for pain and visual disturbance.  Respiratory: Negative for cough and shortness of breath.   Cardiovascular: Negative for cp or palpitations    Gastrointestinal: Negative for nausea, diarrhea and constipation.neg for blood in stool or abd pain   Genitourinary: Negative for urgency and frequency.  Skin: Negative for pallor or rash   Neurological: Negative for weakness, light-headedness, numbness and headaches.  Hematological: Negative for adenopathy. Does not bruise/bleed easily.  Psychiatric/Behavioral: Negative for dysphoric mood. The patient is not nervous/anxious.      Objective:   Physical Exam  Constitutional: She appears well-developed and well-nourished. No distress.  Well appearing   HENT:  Head: Normocephalic and atraumatic.  Mouth/Throat: Oropharynx is clear and moist.  Eyes: Conjunctivae and EOM are normal. Pupils are equal, round, and  reactive to light. No scleral icterus.  Neck: Normal range of motion. Neck supple.  Cardiovascular: Normal rate, regular rhythm and normal heart sounds.   Pulmonary/Chest: Effort normal and breath sounds normal. No respiratory distress. She has no wheezes. She has no rales.  Abdominal: Soft. Bowel sounds are normal. She exhibits no distension and no mass. There is no tenderness. There is no rebound and no guarding.  Lymphadenopathy:    She has no cervical adenopathy.  Neurological: She is alert.  Skin: Skin is warm and dry. No erythema. No pallor.  Psychiatric: She has a normal mood and affect.          Assessment & Plan:    Problem List Items Addressed This Visit      Other   Family history of colon cancer   Colon cancer screening    Disc pros/cons and risks of colon cancer risk for pt with new fam hx of colon cancer (brother in his 60s) and also a sister with polyps She declines colonoscopy Agrees to cologuard program- and agrees to get a colonoscopy for a positive result No symptoms Signed her up for the program         Other Visit Diagnoses    Need for influenza vaccination    -  Primary   Relevant Orders   Flu Vaccine QUAD 36+ mos IM (Completed)

## 2016-07-14 NOTE — Patient Instructions (Signed)
Flu shot today  We will order the cologuard program for colon cancer screening If you ever change your mind and want a colonoscopy please let us know

## 2016-07-27 DIAGNOSIS — Z1212 Encounter for screening for malignant neoplasm of rectum: Secondary | ICD-10-CM | POA: Diagnosis not present

## 2016-07-27 DIAGNOSIS — Z1211 Encounter for screening for malignant neoplasm of colon: Secondary | ICD-10-CM | POA: Diagnosis not present

## 2016-08-08 ENCOUNTER — Encounter: Payer: Self-pay | Admitting: Gastroenterology

## 2016-08-08 ENCOUNTER — Telehealth: Payer: Self-pay | Admitting: *Deleted

## 2016-08-08 DIAGNOSIS — R195 Other fecal abnormalities: Secondary | ICD-10-CM | POA: Insufficient documentation

## 2016-08-08 NOTE — Telephone Encounter (Signed)
We received pt's cologuard results back they were positive, Dr. Glori Bickers put a message on them saying "+ results: I want to refer her for a colonoscopy, please see if agreeable"   Called pt and no answer so left voicemail requesting pt to call the office back

## 2016-08-08 NOTE — Telephone Encounter (Signed)
Pt notified of positive cologuard results and she agrees with referral, please put referral in and I advise pt our [facility] will call and schedule appt

## 2016-08-08 NOTE — Telephone Encounter (Signed)
Patient returned Connie Bell's call.  Please call patient back at [phone].

## 2016-08-08 NOTE — Telephone Encounter (Signed)
Pos cologuard Referred for colonoscopy Will route to [facility]

## 2016-09-02 ENCOUNTER — Ambulatory Visit (AMBULATORY_SURGERY_CENTER): Payer: Self-pay

## 2016-09-02 VITALS — Ht 65.0 in | Wt 157.2 lb

## 2016-09-02 DIAGNOSIS — R195 Other fecal abnormalities: Secondary | ICD-10-CM

## 2016-09-02 DIAGNOSIS — Z800 Family history of malignant neoplasm of digestive organs: Secondary | ICD-10-CM

## 2016-09-02 MED ORDER — NA SULFATE-K SULFATE-MG SULF 17.5-3.13-1.6 GM/177ML PO SOLN: ORAL | 0 refills | Status: DC

## 2016-09-02 NOTE — Progress Notes (Signed)
Per pt, no allergies to soy or egg products.Pt not taking any weight loss meds or using  O2 at home. 

## 2016-09-03 ENCOUNTER — Encounter: Payer: Self-pay | Admitting: Gastroenterology

## 2016-09-15 ENCOUNTER — Ambulatory Visit (AMBULATORY_SURGERY_CENTER): Payer: Medicare Other | Admitting: Gastroenterology

## 2016-09-15 ENCOUNTER — Encounter: Payer: Self-pay | Admitting: Gastroenterology

## 2016-09-15 VITALS — BP 141/73 | HR 66 | Temp 96.8°F | Resp 13 | Ht 65.0 in | Wt 157.0 lb

## 2016-09-15 DIAGNOSIS — D128 Benign neoplasm of rectum: Secondary | ICD-10-CM

## 2016-09-15 DIAGNOSIS — D122 Benign neoplasm of ascending colon: Secondary | ICD-10-CM | POA: Diagnosis not present

## 2016-09-15 DIAGNOSIS — D123 Benign neoplasm of transverse colon: Secondary | ICD-10-CM

## 2016-09-15 DIAGNOSIS — R195 Other fecal abnormalities: Secondary | ICD-10-CM | POA: Diagnosis not present

## 2016-09-15 DIAGNOSIS — D125 Benign neoplasm of sigmoid colon: Secondary | ICD-10-CM | POA: Diagnosis not present

## 2016-09-15 DIAGNOSIS — Z800 Family history of malignant neoplasm of digestive organs: Secondary | ICD-10-CM | POA: Diagnosis present

## 2016-09-15 DIAGNOSIS — D129 Benign neoplasm of anus and anal canal: Secondary | ICD-10-CM

## 2016-09-15 DIAGNOSIS — D120 Benign neoplasm of cecum: Secondary | ICD-10-CM

## 2016-09-15 MED ORDER — SODIUM CHLORIDE 0.9 % IV SOLN: 500.0000 mL | INTRAVENOUS | Status: DC

## 2016-09-15 NOTE — Patient Instructions (Addendum)
   NO IBUPROFEN,NAPROXEN,OR OTHER NON STEROIDAL ANTI INFLAMMATORY PRODUCTS FOR 2 WEEKS  INFORMATION ON POLYPS ,DIVERTICULOSIS ,AND HEMORRHOIDS GIVEN TO YOU TODAY  AWAIT PATHOLOGY RESULTS    YOU HAD AN ENDOSCOPIC PROCEDURE TODAY AT [place]:   Refer to the procedure report that was given to you for any specific questions about what was found during the examination.  If the procedure report does not answer your questions, please call your gastroenterologist to clarify.  If you requested that your care partner not be given the details of your procedure findings, then the procedure report has been included in a sealed envelope for you to review at your convenience later.  YOU SHOULD EXPECT: Some feelings of bloating in the abdomen. Passage of more gas than usual.  Walking can help get rid of the air that was put into your GI tract during the procedure and reduce the bloating. If you had a lower endoscopy (such as a colonoscopy or flexible sigmoidoscopy) you may notice spotting of blood in your stool or on the toilet paper. If you underwent a bowel prep for your procedure, you may not have a normal bowel movement for a few days.  Please Note:  You might notice some irritation and congestion in your nose or some drainage.  This is from the oxygen used during your procedure.  There is no need for concern and it should clear up in a day or so.  SYMPTOMS TO REPORT IMMEDIATELY:   Following lower endoscopy (colonoscopy or flexible sigmoidoscopy):  Excessive amounts of blood in the stool  Significant tenderness or worsening of abdominal pains  Swelling of the abdomen that is new, acute  Fever of 100F or higher    For urgent or emergent issues, a gastroenterologist can be reached at any hour by calling [phone].   DIET:  We do recommend a small meal at first, but then you may proceed to your regular diet.  Drink plenty of fluids but you should avoid alcoholic beverages  for 24 hours.  ACTIVITY:  You should plan to take it easy for the rest of today and you should NOT DRIVE or use heavy machinery until tomorrow (because of the sedation medicines used during the test).    FOLLOW UP: Our staff will call the number listed on your records the next business day following your procedure to check on you and address any questions or concerns that you may have regarding the information given to you following your procedure. If we do not reach you, we will leave a message.  However, if you are feeling well and you are not experiencing any problems, there is no need to return our call.  We will assume that you have returned to your regular daily activities without incident.  If any biopsies were taken you will be contacted by phone or by letter within the next 1-3 weeks.  Please call us at [phone] if you have not heard about the biopsies in 3 weeks.    SIGNATURES/CONFIDENTIALITY: You and/or your care partner have signed paperwork which will be entered into your electronic medical record.  These signatures attest to the fact that that the information above on your After Visit Summary has been reviewed and is understood.  Full responsibility of the confidentiality of this discharge information lies with you and/or your care-partner.

## 2016-09-15 NOTE — Progress Notes (Signed)
Report to PACU, RN, vss, BBS= Clear.  

## 2016-09-15 NOTE — Op Note (Signed)
[place] Patient Name: Connie Bell Procedure Date: 09/15/2016 1:42 PM MRN: [id number] Endoscopist: Remo Lipps P. Armbruster MD, MD Age: 75 Referring MD:  Date of Birth: 1941-05-24 Gender: Female Account #: [id number] Procedure:                Colonoscopy Indications:              This is the patient's first colonoscopy, Positive                            Cologuard test, brother with colon cancer diagnosed                            > age 70 Medicines:                Monitored Anesthesia Care Procedure:                Pre-Anesthesia Assessment:                           - Prior to the procedure, a History and Physical                            was performed, and patient medications and                            allergies were reviewed. The patient's tolerance of                            previous anesthesia was also reviewed. The risks                            and benefits of the procedure and the sedation                            options and risks were discussed with the patient.                            All questions were answered, and informed consent                            was obtained. Prior Anticoagulants: The patient has                            taken no previous anticoagulant or antiplatelet                            agents. ASA Grade Assessment: II - A patient with                            mild systemic disease. After reviewing the risks                            and benefits, the patient was deemed in  satisfactory condition to undergo the procedure.                           After obtaining informed consent, the colonoscope                            was passed under direct vision. Throughout the                            procedure, the patient's blood pressure, pulse, and                            oxygen saturations were monitored continuously. The                            Model PCF-H190L [id number]) scope was  introduced                            through the anus and advanced to the the cecum,                            identified by appendiceal orifice and ileocecal                            valve. The colonoscopy was performed without                            difficulty. The patient tolerated the procedure                            well. The quality of the bowel preparation was                            good. The ileocecal valve, appendiceal orifice, and                            rectum were photographed. Scope In: 1:58:41 PM Scope Out: [id number] PM Scope Withdrawal Time: 0 hours 31 minutes 32 seconds  Total Procedure Duration: 0 hours 39 minutes 37 seconds  Findings:                 The perianal exam findings include non-thrombosed                            external hemorrhoids.                           A 4 mm polyp was found in the cecum. The polyp was                            sessile. The polyp was removed with a cold snare.                            Resection and retrieval were complete.  A 5 mm polyp was found in the ileocecal valve. The                            polyp was sessile. The polyp was removed with a                            cold snare. Resection and retrieval were complete.                           Three sessile polyps were found in the ascending                            colon. The polyps were 4 to 5 mm in size. These                            polyps were removed with a cold snare. One of which                            was located by a fold along an angulated turn and                            was technically challenging to remove. Resection                            and retrieval were complete.                           A 5 mm polyp was found in the hepatic flexure. The                            polyp was sessile. The polyp was removed with a                            cold snare. Resection and retrieval were complete.                            Five sessile polyps were found in the transverse                            colon. The polyps were 4 to 5 mm in size. These                            polyps were removed with a cold snare. Resection                            and retrieval were complete.                           A 5 mm polyp was found in the splenic flexure. The  polyp was sessile. The polyp was removed with a                            cold snare. Resection and retrieval were complete.                           Four sessile polyps were found in the sigmoid                            colon. The polyps were 4 to 7 mm in size. These                            polyps were removed with a cold snare. Resection                            and retrieval were complete.                           Two sessile polyps were found in the rectum. The                            polyps were 3 to 4 mm in size. These polyps were                            removed with a cold snare. Resection and retrieval                            were complete.                           A few medium-mouthed diverticula were found in the                            left colon.                           Non-bleeding internal hemorrhoids were found during                            retroflexion.                           The exam was otherwise without abnormality. Complications:            No immediate complications. Estimated blood loss:                            Minimal. Estimated Blood Loss:     Estimated blood loss was minimal. Impression:               - Non-thrombosed external hemorrhoids found on                            perianal exam.                           -  One 4 mm polyp in the cecum, removed with a cold                            snare. Resected and retrieved.                           - One 5 mm polyp at the ileocecal valve, removed                            with a cold snare. Resected and  retrieved.                           - Three 4 to 5 mm polyps in the ascending colon,                            removed with a cold snare. Resected and retrieved.                           - One 5 mm polyp at the hepatic flexure, removed                            with a cold snare. Resected and retrieved.                           - Five 4 to 5 mm polyps in the transverse colon,                            removed with a cold snare. Resected and retrieved.                           - One 5 mm polyp at the splenic flexure, removed                            with a cold snare. Resected and retrieved.                           - Four 4 to 7 mm polyps in the sigmoid colon,                            removed with a cold snare. Resected and retrieved.                           - Two 3 to 4 mm polyps in the rectum, removed with                            a cold snare. Resected and retrieved.                           - Diverticulosis in the left colon.                           - Non-bleeding  internal hemorrhoids.                           - The examination was otherwise normal. Recommendation:           - Patient has a contact number available for                            emergencies. The signs and symptoms of potential                            delayed complications were discussed with the                            patient. Return to normal activities tomorrow.                            Written discharge instructions were provided to the                            patient.                           - Resume previous diet.                           - Continue present medications.                           - No ibuprofen, naproxen, or other non-steroidal                            anti-inflammatory drugs for 2 weeks after polyp                            removal.                           - Await pathology results.                           - Repeat colonoscopy is recommended for                             surveillance. The colonoscopy date will be                            determined after pathology results from today's                            exam become available for review. Remo Lipps P. Armbruster MD, MD 09/15/2016 2:47:59 PM This report has been signed electronically.

## 2016-09-16 ENCOUNTER — Telehealth: Payer: Self-pay | Admitting: *Deleted

## 2016-09-16 NOTE — Telephone Encounter (Signed)
  Follow up Call-  Call back number 09/15/2016  Post procedure Call Back phone  # [phone]  Permission to leave phone message Yes  Some recent data might be hidden     Patient questions:  Do you have a fever, pain , or abdominal swelling? No. Pain Score  0 *  Have you tolerated food without any problems? Yes.    Have you been able to return to your normal activities? Yes.    Do you have any questions about your discharge instructions: Diet   No. Medications  No. Follow up visit  No.  Do you have questions or concerns about your Care? No.  Actions: * If pain score is 4 or above: No action needed, pain <4.

## 2016-09-18 ENCOUNTER — Encounter: Payer: Self-pay | Admitting: Gastroenterology

## 2017-01-22 ENCOUNTER — Telehealth: Payer: Self-pay | Admitting: Family Medicine

## 2017-01-22 NOTE — Telephone Encounter (Signed)
Left pt message asking to call Allison back directly at [phone] to schedule AWV.+ labs with Lesia and CPE with PCP. °

## 2017-02-04 DIAGNOSIS — D235 Other benign neoplasm of skin of trunk: Secondary | ICD-10-CM | POA: Diagnosis not present

## 2017-02-04 DIAGNOSIS — D1801 Hemangioma of skin and subcutaneous tissue: Secondary | ICD-10-CM | POA: Diagnosis not present

## 2017-02-04 DIAGNOSIS — L821 Other seborrheic keratosis: Secondary | ICD-10-CM | POA: Diagnosis not present

## 2017-02-04 DIAGNOSIS — L578 Other skin changes due to chronic exposure to nonionizing radiation: Secondary | ICD-10-CM | POA: Diagnosis not present

## 2017-02-04 DIAGNOSIS — D485 Neoplasm of uncertain behavior of skin: Secondary | ICD-10-CM | POA: Diagnosis not present

## 2017-02-04 DIAGNOSIS — C4441 Basal cell carcinoma of skin of scalp and neck: Secondary | ICD-10-CM | POA: Diagnosis not present

## 2017-02-12 DIAGNOSIS — L821 Other seborrheic keratosis: Secondary | ICD-10-CM | POA: Diagnosis not present

## 2017-02-12 DIAGNOSIS — D235 Other benign neoplasm of skin of trunk: Secondary | ICD-10-CM | POA: Diagnosis not present

## 2017-02-12 DIAGNOSIS — Z85828 Personal history of other malignant neoplasm of skin: Secondary | ICD-10-CM | POA: Diagnosis not present

## 2017-02-12 DIAGNOSIS — D485 Neoplasm of uncertain behavior of skin: Secondary | ICD-10-CM | POA: Diagnosis not present

## 2017-02-12 DIAGNOSIS — D1801 Hemangioma of skin and subcutaneous tissue: Secondary | ICD-10-CM | POA: Diagnosis not present

## 2017-02-18 ENCOUNTER — Other Ambulatory Visit: Payer: Self-pay | Admitting: Family Medicine

## 2017-02-18 DIAGNOSIS — Z1231 Encounter for screening mammogram for malignant neoplasm of breast: Secondary | ICD-10-CM

## 2017-03-10 NOTE — Telephone Encounter (Signed)
Left pt message asking to call Allison back directly at [phone] to schedule AWV.+ labs with Lesia and CPE with PCP. °

## 2017-03-26 ENCOUNTER — Ambulatory Visit
Admission: RE | Admit: 2017-03-26 | Discharge: 2017-03-26 | Disposition: A | Discharge status: Home / Self Care | Payer: Medicare Other | Source: Ambulatory Visit | Attending: Family Medicine | Admitting: Family Medicine

## 2017-03-26 DIAGNOSIS — Z1231 Encounter for screening mammogram for malignant neoplasm of breast: Secondary | ICD-10-CM

## 2017-03-27 ENCOUNTER — Encounter: Payer: Self-pay | Admitting: *Deleted

## 2017-04-15 DIAGNOSIS — C4441 Basal cell carcinoma of skin of scalp and neck: Secondary | ICD-10-CM | POA: Diagnosis not present

## 2017-07-07 ENCOUNTER — Ambulatory Visit (INDEPENDENT_AMBULATORY_CARE_PROVIDER_SITE_OTHER): Payer: Medicare Other

## 2017-07-07 ENCOUNTER — Telehealth (INDEPENDENT_AMBULATORY_CARE_PROVIDER_SITE_OTHER): Payer: Medicare Other | Admitting: Family Medicine

## 2017-07-07 VITALS — BP 120/80 | HR 73 | Temp 98.1°F | Ht 63.5 in | Wt 157.0 lb

## 2017-07-07 DIAGNOSIS — Z0000 Encounter for general adult medical examination without abnormal findings: Secondary | ICD-10-CM | POA: Diagnosis not present

## 2017-07-07 DIAGNOSIS — E7800 Pure hypercholesterolemia, unspecified: Secondary | ICD-10-CM

## 2017-07-07 LAB — CBC WITH DIFFERENTIAL/PLATELET
BASOS PCT: 1.1 % (ref 0.0–3.0)
Basophils Absolute: 0.1 10*3/uL (ref 0.0–0.1)
EOS PCT: 2.3 % (ref 0.0–5.0)
Eosinophils Absolute: 0.2 10*3/uL (ref 0.0–0.7)
HEMATOCRIT: 39.8 % (ref 36.0–46.0)
HEMOGLOBIN: 13.2 g/dL (ref 12.0–15.0)
LYMPHS PCT: 30.1 % (ref 12.0–46.0)
Lymphs Abs: 2.1 10*3/uL (ref 0.7–4.0)
MCHC: 33.2 g/dL (ref 30.0–36.0)
MCV: 94.1 fl (ref 78.0–100.0)
Monocytes Absolute: 0.6 10*3/uL (ref 0.1–1.0)
Monocytes Relative: 7.9 % (ref 3.0–12.0)
Neutro Abs: 4.1 10*3/uL (ref 1.4–7.7)
Neutrophils Relative %: 58.6 % (ref 43.0–77.0)
Platelets: 380 10*3/uL (ref 150.0–400.0)
RBC: 4.23 Mil/uL (ref 3.87–5.11)
RDW: 14.3 % (ref 11.5–15.5)
WBC: 7 10*3/uL (ref 4.0–10.5)

## 2017-07-07 LAB — COMPREHENSIVE METABOLIC PANEL
ALBUMIN: 4 g/dL (ref 3.5–5.2)
ALK PHOS: 74 U/L (ref 39–117)
ALT: 16 U/L (ref 0–35)
AST: 14 U/L (ref 0–37)
BUN: 15 mg/dL (ref 6–23)
CALCIUM: 9.6 mg/dL (ref 8.4–10.5)
CHLORIDE: 102 meq/L (ref 96–112)
CO2: 31 mEq/L (ref 19–32)
CREATININE: 0.83 mg/dL (ref 0.40–1.20)
GFR: 71.08 mL/min (ref >60.00)
Glucose, Bld: 87 mg/dL (ref 70–99)
Potassium: 4 mEq/L (ref 3.5–5.1)
Sodium: 137 mEq/L (ref 135–145)
TOTAL PROTEIN: 7.8 g/dL (ref 6.0–8.3)
Total Bilirubin: 0.5 mg/dL (ref 0.2–1.2)

## 2017-07-07 LAB — LIPID PANEL
CHOL/HDL RATIO: 6
CHOLESTEROL: 246 mg/dL — AB (ref 0–200)
HDL: 42.7 mg/dL (ref >39.00)
NonHDL: 203.54
Triglycerides: 392 mg/dL — ABNORMAL HIGH (ref 0.0–149.0)
VLDL: 78.4 mg/dL — ABNORMAL HIGH (ref 0.0–40.0)

## 2017-07-07 LAB — TSH: TSH: 1.63 u[IU]/mL (ref 0.35–4.50)

## 2017-07-07 LAB — LDL CHOLESTEROL, DIRECT: Direct LDL: 140 mg/dL

## 2017-07-07 NOTE — Patient Instructions (Signed)
Connie Bell , Thank you for taking time to come for your Medicare Wellness Visit. I appreciate your ongoing commitment to your health goals. Please review the following plan we discussed and let me know if I can assist you in the future.   These are the goals we discussed: Goals    . Increase physical activity          Starting 07/07/2017, I will continue to walk at least 20 min daily.        This is a list of the screening recommended for you and due dates:  Health Maintenance  Topic Date Due  . Flu Shot  02/14/2018*  . Tetanus Vaccine  12/19/2020*  . Pneumonia vaccines (1 of 2 - PCV13) 12/20/2023*  . Colon Cancer Screening  09/15/2017  . Mammogram  03/26/2018  . DEXA scan (bone density measurement)  Completed  *Topic was postponed. The date shown is not the original due date.   Preventive Care for Adults  A healthy lifestyle and preventive care can promote health and wellness. Preventive health guidelines for adults include the following key practices.  . A routine yearly physical is a good way to check with your health care provider about your health and preventive screening. It is a chance to share any concerns and updates on your health and to receive a thorough exam.  . Visit your dentist for a routine exam and preventive care every 6 months. Brush your teeth twice a day and floss once a day. Good oral hygiene prevents tooth decay and gum disease.  . The frequency of eye exams is based on your age, health, family medical history, use  of contact lenses, and other factors. Follow your health care provider's ecommendations for frequency of eye exams.  . Eat a healthy diet. Foods like vegetables, fruits, whole grains, low-fat dairy products, and lean protein foods contain the nutrients you need without too many calories. Decrease your intake of foods high in solid fats, added sugars, and salt. Eat the right amount of calories for you. Get information about a proper diet from your  health care provider, if necessary.  . Regular physical exercise is one of the most important things you can do for your health. Most adults should get at least 150 minutes of moderate-intensity exercise (any activity that increases your heart rate and causes you to sweat) each week. In addition, most adults need muscle-strengthening exercises on 2 or more days a week.  Silver Sneakers may be a benefit available to you. To determine eligibility, you may visit the website: www.silversneakers.com or contact program at 825-685-8892 Mon-Fri between 8AM-8PM.   . Maintain a healthy weight. The body mass index (BMI) is a screening tool to identify possible weight problems. It provides an estimate of body fat based on height and weight. Your health care provider can find your BMI and can help you achieve or maintain a healthy weight.   For adults 20 years and older: ? A BMI below 18.5 is considered underweight. ? A BMI of 18.5 to 24.9 is normal. ? A BMI of 25 to 29.9 is considered overweight. ? A BMI of 30 and above is considered obese.   . Maintain normal blood lipids and cholesterol levels by exercising and minimizing your intake of saturated fat. Eat a balanced diet with plenty of fruit and vegetables. Blood tests for lipids and cholesterol should begin at age 36 and be repeated every 5 years. If your lipid or cholesterol levels are high,  you are over 50, or you are at high risk for heart disease, you may need your cholesterol levels checked more frequently. Ongoing high lipid and cholesterol levels should be treated with medicines if diet and exercise are not working.  . If you smoke, find out from your health care provider how to quit. If you do not use tobacco, please do not start.  . If you choose to drink alcohol, please do not consume more than 2 drinks per day. One drink is considered to be 12 ounces (355 mL) of beer, 5 ounces (148 mL) of wine, or 1.5 ounces (44 mL) of liquor.  . If you are  53-9 years old, ask your health care provider if you should take aspirin to prevent strokes.  . Use sunscreen. Apply sunscreen liberally and repeatedly throughout the day. You should seek shade when your shadow is shorter than you. Protect yourself by wearing long sleeves, pants, a wide-brimmed hat, and sunglasses year round, whenever you are outdoors.  . Once a month, do a whole body skin exam, using a mirror to look at the skin on your back. Tell your health care provider of new moles, moles that have irregular borders, moles that are larger than a pencil eraser, or moles that have changed in shape or color.

## 2017-07-07 NOTE — Telephone Encounter (Signed)
-----   Message from Eustace Pen, LPN sent at 3/81/8403  1:55 PM EDT ----- Regarding: Labs 07/07/17 Please place lab orders.  [facility] Medicare

## 2017-07-07 NOTE — Progress Notes (Signed)
Subjective:   Connie Bell is a 76 y.o. female who presents for Medicare Annual (Subsequent) preventive examination.  Review of Systems:  N/A Cardiac Risk Factors include: advanced age (>77men, >53 women);dyslipidemia     Objective:     Vitals: BP 120/80 (BP Location: Right Arm, Patient Position: Sitting, Cuff Size: Normal)   Pulse 73   Temp 98.1 F (36.7 C) (Oral)   Ht 5' 3.5" (1.613 m) Comment: no shoes  Wt 157 lb (71.2 kg)   SpO2 95%   BMI 27.38 kg/m   Body mass index is 27.38 kg/m.   Tobacco History  Smoking Status  . Never Smoker  Smokeless Tobacco  . Never Used     Counseling given: No   Past Medical History:  Diagnosis Date  . Depression   . Hyperlipemia   . Rosacea   . Skin cancer    on nose and neck 2 times/basal cell   Past Surgical History:  Procedure Laterality Date  . HEMORRHOID SURGERY     Pt passed out past surgery due to pain!  Marland Kitchen PARTIAL HYSTERECTOMY     secondary to abn pap  . Patient refuses     mammograms, chol treatment and vaccines   Family History  Problem Relation Age of Onset  . Heart disease Mother        MI  . Cancer Sister 46       lung cancer  . Colon cancer Brother   . Colon polyps Sister   . Heart disease Father        CHF   History  Sexual Activity  . Sexual activity: Not on file    Outpatient Encounter Prescriptions as of 07/07/2017  Medication Sig  . Cholecalciferol (VITAMIN D) 2000 UNITS CAPS Take 2,000 Units by mouth daily.   . NON FORMULARY Doterra on Guard-Take one daily  . Omega-3 Fatty Acids (FISH OIL) 1000 MG CAPS Take 1pill by mouth daily   Facility-Administered Encounter Medications as of 07/07/2017  Medication  . 0.9 %  sodium chloride infusion    Activities of Daily Living In your present state of health, do you have any difficulty performing the following activities: 07/07/2017  Hearing? N  Vision? N  Difficulty concentrating or making decisions? N  Walking or climbing stairs? N    Dressing or bathing? N  Doing errands, shopping? N  Preparing Food and eating ? N  Using the Toilet? N  In the past six months, have you accidently leaked urine? N  Do you have problems with loss of bowel control? N  Managing your Medications? N  Managing your Finances? N  Housekeeping or managing your Housekeeping? N  Some recent data might be hidden    Patient Care Team: Tower, Wynelle Fanny, MD as PCP - General    Assessment:     Hearing Screening   125Hz  250Hz  500Hz  1000Hz  2000Hz  3000Hz  4000Hz  6000Hz  8000Hz   Right ear:   40 0 0  0    Left ear:   40 40 40  0    Vision Screening Comments: Last vision exam in 2017   Exercise Activities and Dietary recommendations Current Exercise Habits: Home exercise routine, Type of exercise: walking;treadmill, Time (Minutes): 20, Frequency (Times/Week): 7, Weekly Exercise (Minutes/Week): 140, Intensity: Mild, Exercise limited by: None identified  Goals    . Increase physical activity          Starting 07/07/2017, I will continue to walk at least 20 min daily.  Fall Risk Fall Risk  07/07/2017  Falls in the past year? No   Depression Screen PHQ 2/9 Scores 07/07/2017  PHQ - 2 Score 1  PHQ- 9 Score 1     Cognitive Function MMSE - Mini Mental State Exam 07/07/2017  Orientation to time 5  Orientation to Place 5  Registration 3  Attention/ Calculation 0  Recall 2  Recall-comments pt was unable to recall 1 of 3 words  Language- name 2 objects 0  Language- repeat 1  Language- follow 3 step command 3  Language- read & follow direction 0  Write a sentence 0  Copy design 0  Total score 19        Immunization History  Administered Date(s) Administered  . Influenza,inj,Quad PF,6+ Mos 08/26/2013, 07/14/2016  . Influenza-Unspecified 08/17/2014  . PPD Test 07/20/2013  . Td 11/17/1989   Screening Tests Health Maintenance  Topic Date Due  . INFLUENZA VACCINE  02/14/2018 (Originally 06/17/2017)  . TETANUS/TDAP  12/19/2020  (Originally 11/18/1999)  . PNA vac Low Risk Adult (1 of 2 - PCV13) 12/20/2023 (Originally 09/26/2006)  . COLONOSCOPY  09/15/2017  . MAMMOGRAM  03/26/2018  . DEXA SCAN  Completed      Plan:     I have personally reviewed and addressed the Medicare Annual Wellness questionnaire and have noted the following in the patient's chart:  A. Medical and social history B. Use of alcohol, tobacco or illicit drugs  C. Current medications and supplements D. Functional ability and status E.  Nutritional status F.  Physical activity G. Advance directives H. List of other physicians I.  Hospitalizations, surgeries, and ER visits in previous 12 months J.  [place] to include hearing, vision, cognitive, depression L. Referrals and appointments - none  In addition, I have reviewed and discussed with patient certain preventive protocols, quality metrics, and best practice recommendations. A written personalized care plan for preventive services as well as general preventive health recommendations were provided to patient.  See attached scanned questionnaire for additional information.   Signed,   Lindell Noe, MHA, BS, LPN Health Coach

## 2017-07-07 NOTE — Progress Notes (Signed)
PCP notes:   Health maintenance:  Flu vaccine - addressed  Abnormal screenings:   Hearing - failed Depression score: 1 Mini- Cog score: 19/20  Patient concerns:   None  Nurse concerns:  None  Next PCP appt:   07/14/17 @ 1991  I reviewed health advisor's note, was available for consultation, and agree with documentation and plan. Loura Pardon MD

## 2017-07-14 ENCOUNTER — Encounter: Payer: Self-pay | Admitting: Family Medicine

## 2017-07-14 ENCOUNTER — Ambulatory Visit (INDEPENDENT_AMBULATORY_CARE_PROVIDER_SITE_OTHER): Payer: Medicare Other | Admitting: Family Medicine

## 2017-07-14 VITALS — BP 128/76 | HR 76 | Temp 98.4°F | Ht 63.5 in | Wt 157.0 lb

## 2017-07-14 DIAGNOSIS — E7800 Pure hypercholesterolemia, unspecified: Secondary | ICD-10-CM | POA: Diagnosis not present

## 2017-07-14 DIAGNOSIS — Z0000 Encounter for general adult medical examination without abnormal findings: Secondary | ICD-10-CM | POA: Diagnosis not present

## 2017-07-14 DIAGNOSIS — Z1211 Encounter for screening for malignant neoplasm of colon: Secondary | ICD-10-CM | POA: Diagnosis not present

## 2017-07-14 DIAGNOSIS — Z800 Family history of malignant neoplasm of digestive organs: Secondary | ICD-10-CM

## 2017-07-14 NOTE — Progress Notes (Signed)
Subjective:    Patient ID: Connie Bell, female    DOB: Feb 25, 1941, 76 y.o.   MRN: [id number]  HPI  Here for health maintenance exam and to review chronic medical problems    Has been feeling good   amw 8/21 Failed hearing eval in [place] -that is her bad ear/ she does not want a hearing aide  Mini cog 19/20 - missed one word in recall (othrwise did well) She does socialize -no problems with memory at home  Depression score 1 Says mood is good    Wt Readings from Last 3 Encounters:  07/14/17 157 lb (71.2 kg)  07/07/17 157 lb (71.2 kg)  09/15/16 157 lb (71.2 kg)  manages to mt her weight  Exercises - regularly / walks and does stretches  Diet is fairly healthy- lot of veg and fruits  Chicken or fish for protein (no red meat)  27.38 kg/m  Pt usually declines most health mt incl flu shot/ tetanus shot and pneumonia shots  She did get a flu shot last season   BP Readings from Last 3 Encounters:  07/14/17 128/76  07/07/17 120/80  09/15/16 (!) 141/73   Pulse Readings from Last 3 Encounters:  07/14/17 76  07/07/17 73  09/15/16 66    Colonoscopy 10/17-multiple polyps  Brother had colon cancer  Recall 1 y 11/18  Mammogram 5/18 neg  Self breast exam -no lumps   No gyn symptoms    dexa 2/05 Not interested in dexa  No falls  No fractures in adult hood  Takes vit D daily and a mvi   Hx of basal cell skin ca  Follows closely with dermatology  Using sun screen    Hyperlipidemia Lab Results  Component Value Date   CHOL 246 (H) 07/07/2017   HDL 42.70 07/07/2017   LDLDIRECT 140.0 07/07/2017   TRIG 392.0 (H) 07/07/2017   CHOLHDL 6 07/07/2017   Eats a pretty healthy diet  This may be hereditaty Loves her sweets  May be open to cholesterol med if this goes up further    Results for orders placed or performed in visit on 07/07/17  CBC with Differential/Platelet  Result Value Ref Range   WBC 7.0 4.0 - 10.5 K/uL   RBC 4.23 3.87 - 5.11 Mil/uL   Hemoglobin  13.2 12.0 - 15.0 g/dL   HCT 39.8 36.0 - 46.0 %   MCV 94.1 78.0 - 100.0 fl   MCHC 33.2 30.0 - 36.0 g/dL   RDW 14.3 11.5 - 15.5 %   Platelets 380.0 150.0 - 400.0 K/uL   Neutrophils Relative % 58.6 43.0 - 77.0 %   Lymphocytes Relative 30.1 12.0 - 46.0 %   Monocytes Relative 7.9 3.0 - 12.0 %   Eosinophils Relative 2.3 0.0 - 5.0 %   Basophils Relative 1.1 0.0 - 3.0 %   Neutro Abs 4.1 1.4 - 7.7 K/uL   Lymphs Abs 2.1 0.7 - 4.0 K/uL   Monocytes Absolute 0.6 0.1 - 1.0 K/uL   Eosinophils Absolute 0.2 0.0 - 0.7 K/uL   Basophils Absolute 0.1 0.0 - 0.1 K/uL  Comprehensive metabolic panel  Result Value Ref Range   Sodium 137 135 - 145 mEq/L   Potassium 4.0 3.5 - 5.1 mEq/L   Chloride 102 96 - 112 mEq/L   CO2 31 19 - 32 mEq/L   Glucose, Bld 87 70 - 99 mg/dL   BUN 15 6 - 23 mg/dL   Creatinine, Ser 0.83 0.40 - 1.20  mg/dL   Total Bilirubin 0.5 0.2 - 1.2 mg/dL   Alkaline Phosphatase 74 39 - 117 U/L   AST 14 0 - 37 U/L   ALT 16 0 - 35 U/L   Total Protein 7.8 6.0 - 8.3 g/dL   Albumin 4.0 3.5 - 5.2 g/dL   Calcium 9.6 8.4 - 10.5 mg/dL   GFR 71.08 >60.00 mL/min  Lipid panel  Result Value Ref Range   Cholesterol 246 (H) 0 - 200 mg/dL   Triglycerides 392.0 (H) 0.0 - 149.0 mg/dL   HDL 42.70 >39.00 mg/dL   VLDL 78.4 (H) 0.0 - 40.0 mg/dL   Total CHOL/HDL Ratio 6    NonHDL 203.54   TSH  Result Value Ref Range   TSH 1.63 0.35 - 4.50 uIU/mL  LDL cholesterol, direct  Result Value Ref Range   Direct LDL 140.0 mg/dL     Patient Active Problem List   Diagnosis Date Noted  . Routine general medical examination at a health care facility 07/07/2017  . Heme positive stool 08/08/2016  . Colon cancer screening 07/14/2016  . Family history of colon cancer 07/14/2016  . Pre-employment examination 08/26/2013  . Hemorrhoids 10/28/2011  . HEMORRHOIDS, INTERNAL 01/08/2011  . HYPERCHOLESTEROLEMIA 01/12/2008  . DEPRESSION 01/12/2008  . ROSACEA 01/12/2008   Past Medical History:  Diagnosis Date  .  Depression   . Hyperlipemia   . Rosacea   . Skin cancer    on nose and neck 2 times/basal cell   Past Surgical History:  Procedure Laterality Date  . HEMORRHOID SURGERY     Pt passed out past surgery due to pain!  Marland Kitchen PARTIAL HYSTERECTOMY     secondary to abn pap  . Patient refuses     mammograms, chol treatment and vaccines   Social History  Substance Use Topics  . Smoking status: Never Smoker  . Smokeless tobacco: Never Used  . Alcohol use No   Family History  Problem Relation Age of Onset  . Heart disease Mother        MI  . Cancer Sister 9       lung cancer  . Colon cancer Brother   . Colon polyps Sister   . Heart disease Father        CHF   Allergies  Allergen Reactions  . Alendronate Sodium     REACTION: depressed, headaches, stomach aches  . Morphine     REACTION: /whelts  . Atorvastatin     REACTION: u/k   Current Outpatient Prescriptions on File Prior to Visit  Medication Sig Dispense Refill  . Cholecalciferol (VITAMIN D) 2000 UNITS CAPS Take 2,000 Units by mouth daily.     . NON FORMULARY Doterra on Guard-Take one daily    . Omega-3 Fatty Acids (FISH OIL) 1000 MG CAPS Take 1pill by mouth daily     Current Facility-Administered Medications on File Prior to Visit  Medication Dose Route Frequency Provider Last Rate Last Dose  . 0.9 %  sodium chloride infusion  500 mL Intravenous Continuous Armbruster, Renelda Loma, MD         Review of Systems    Review of Systems  Constitutional: Negative for fever, appetite change, fatigue and unexpected weight change.  Eyes: Negative for pain and visual disturbance.  ENT pos for hearing loss  Respiratory: Negative for cough and shortness of breath.   Cardiovascular: Negative for cp or palpitations    Gastrointestinal: Negative for nausea, diarrhea and constipation.  Genitourinary: Negative for urgency  and frequency.  Skin: Negative for pallor or rash   Neurological: Negative for weakness, light-headedness,  numbness and headaches.  Hematological: Negative for adenopathy. Does not bruise/bleed easily.  Psychiatric/Behavioral: Negative for dysphoric mood. The patient is not nervous/anxious.      Objective:   Physical Exam  Constitutional: She appears well-developed and well-nourished. No distress.  Well appearing elderly female   HENT:  Head: Normocephalic and atraumatic.  Right Ear: External ear normal.  Left Ear: External ear normal.  Mouth/Throat: Oropharynx is clear and moist.  Eyes: Pupils are equal, round, and reactive to light. Conjunctivae and EOM are normal. No scleral icterus.  Neck: Normal range of motion. Neck supple. No JVD present. Carotid bruit is not present. No thyromegaly present.  Cardiovascular: Normal rate, regular rhythm, normal heart sounds and intact distal pulses.  Exam reveals no gallop.   Pulmonary/Chest: Effort normal and breath sounds normal. No respiratory distress. She has no wheezes. She exhibits no tenderness.  Abdominal: Soft. Bowel sounds are normal. She exhibits no distension, no abdominal bruit and no mass. There is no tenderness.  Genitourinary: No breast swelling, tenderness, discharge or bleeding.  Genitourinary Comments: Breast exam: No mass, nodules, thickening, tenderness, bulging, retraction, inflamation, nipple discharge or skin changes noted.  No axillary or clavicular LA.      Musculoskeletal: Normal range of motion. She exhibits no edema or tenderness.  Lymphadenopathy:    She has no cervical adenopathy.  Neurological: She is alert. She has normal reflexes. No cranial nerve deficit. She exhibits normal muscle tone. Coordination normal.  Skin: Skin is warm and dry. No rash noted. No erythema. No pallor.  Solar lentigines diffusely sks noted on trunk   Psychiatric: She has a normal mood and affect.          Assessment & Plan:   Problem List Items Addressed This Visit      Other   Colon cancer screening    fam hx  Polyps last  colonoscopy  Will have another one in Nov as planned      Family history of colon cancer    Due for next colonoscopy in nov- aware       HYPERCHOLESTEROLEMIA    Disc goals for lipids and reasons to control them Rev labs with pt Rev low sat fat diet in detail This is up  She will work on diet  May be open to statin in the future if not imp  Disc exercise to raise HDL      Routine general medical examination at a health care facility - Primary    Reviewed health habits including diet and exercise and skin cancer prevention Reviewed appropriate screening tests for age  Also reviewed health mt list, fam hx and immunization status , as well as social and family history    HPI reviewed  amw reviewed  Declines hearing augmentation  Declines dexa and pneumonia vaccines and tetanus vaccines    You are due for a colonoscopy in November and should get a letter  Let us know if you do not get a reminder   Don't forget to get a flu shot in the fall   If you change your mind about other vaccines please let us know   Continue your vitamin D daily for bone health   Continue dermatology follow up

## 2017-07-14 NOTE — Patient Instructions (Addendum)
You are due for a colonoscopy in November and should get a letter  Let us know if you do not get a reminder   Don't forget to get a flu shot in the fall   If you change your mind about other vaccines please let us know   Continue your vitamin D daily for bone health   Continue dermatology follow up   For cholesterol   Avoid red meat/ fried foods/ egg yolks/ fatty breakfast meats/ butter, cheese and high fat dairy/ and shellfish   Lower processed carb intake (sweets) may contribute to triglycerides

## 2017-07-15 NOTE — Assessment & Plan Note (Signed)
fam hx  Polyps last colonoscopy  Will have another one in Nov as planned

## 2017-07-15 NOTE — Assessment & Plan Note (Signed)
Disc goals for lipids and reasons to control them Rev labs with pt Rev low sat fat diet in detail This is up  She will work on diet  May be open to statin in the future if not imp  Disc exercise to raise HDL

## 2017-07-15 NOTE — Assessment & Plan Note (Signed)
Due for next colonoscopy in nov- aware

## 2017-07-15 NOTE — Assessment & Plan Note (Addendum)
Reviewed health habits including diet and exercise and skin cancer prevention Reviewed appropriate screening tests for age  Also reviewed health mt list, fam hx and immunization status , as well as social and family history    HPI reviewed  amw reviewed  Declines hearing augmentation  Declines dexa and pneumonia vaccines and tetanus vaccines    You are due for a colonoscopy in November and should get a letter  Let us know if you do not get a reminder   Don't forget to get a flu shot in the fall   If you change your mind about other vaccines please let us know   Continue your vitamin D daily for bone health   Continue dermatology follow up

## 2017-09-09 ENCOUNTER — Ambulatory Visit: Payer: Medicare Other

## 2017-10-07 ENCOUNTER — Encounter: Payer: Self-pay | Admitting: Gastroenterology

## 2017-11-25 ENCOUNTER — Encounter: Payer: Self-pay | Admitting: Gastroenterology

## 2017-12-24 ENCOUNTER — Ambulatory Visit (AMBULATORY_SURGERY_CENTER): Payer: Self-pay | Admitting: *Deleted

## 2017-12-24 ENCOUNTER — Encounter: Payer: Self-pay | Admitting: Gastroenterology

## 2017-12-24 ENCOUNTER — Other Ambulatory Visit: Payer: Self-pay

## 2017-12-24 VITALS — Ht 65.0 in | Wt 162.8 lb

## 2017-12-24 DIAGNOSIS — Z86010 Personal history of colonic polyps: Secondary | ICD-10-CM

## 2017-12-24 DIAGNOSIS — Z800 Family history of malignant neoplasm of digestive organs: Secondary | ICD-10-CM

## 2017-12-24 MED ORDER — NA SULFATE-K SULFATE-MG SULF 17.5-3.13-1.6 GM/177ML PO SOLN: 1.0000 [IU] | Freq: Once | ORAL | 0 refills | Status: AC

## 2017-12-24 NOTE — Progress Notes (Signed)
No egg or soy allergy known to patient  No issues with past sedation with any surgeries  or procedures, no intubation problems  No diet pills per patient No home 02 use per patient  No blood thinners per patient  Pt denies issues with constipation  No A fib or A flutter  EMMI video sent to pt's e mail pt declined   

## 2018-01-05 ENCOUNTER — Ambulatory Visit (AMBULATORY_SURGERY_CENTER): Payer: Medicare Other | Admitting: Gastroenterology

## 2018-01-05 ENCOUNTER — Encounter: Payer: Self-pay | Admitting: Gastroenterology

## 2018-01-05 ENCOUNTER — Other Ambulatory Visit: Payer: Self-pay

## 2018-01-05 VITALS — BP 143/71 | HR 65 | Temp 98.7°F | Resp 16 | Ht 65.0 in | Wt 162.0 lb

## 2018-01-05 DIAGNOSIS — D122 Benign neoplasm of ascending colon: Secondary | ICD-10-CM

## 2018-01-05 DIAGNOSIS — K635 Polyp of colon: Secondary | ICD-10-CM

## 2018-01-05 DIAGNOSIS — Z86010 Personal history of colonic polyps: Secondary | ICD-10-CM | POA: Diagnosis present

## 2018-01-05 DIAGNOSIS — Z1211 Encounter for screening for malignant neoplasm of colon: Secondary | ICD-10-CM | POA: Diagnosis not present

## 2018-01-05 DIAGNOSIS — D125 Benign neoplasm of sigmoid colon: Secondary | ICD-10-CM | POA: Diagnosis not present

## 2018-01-05 DIAGNOSIS — D123 Benign neoplasm of transverse colon: Secondary | ICD-10-CM

## 2018-01-05 MED ORDER — SODIUM CHLORIDE 0.9 % IV SOLN: 500.0000 mL | Freq: Once | INTRAVENOUS | Status: DC

## 2018-01-05 NOTE — Op Note (Signed)
[facility] Patient Name: Connie Bell Procedure Date: 01/05/2018 1:36 PM MRN: [id number] Endoscopist: Remo Lipps P. Armbruster MD, MD Age: 77 Referring MD:  Date of Birth: [birth year] Gender: Female Account #: [id number] Procedure:                Colonoscopy Indications:              Surveillance: History of numerous (18) adenomas /                            sessile serrated polyps on last colonoscopy in                            2017. Brother with colon cancer diagnosed age 11s Medicines:                Monitored Anesthesia Care Procedure:                Pre-Anesthesia Assessment:                           - Prior to the procedure, a History and Physical                            was performed, and patient medications and                            allergies were reviewed. The patient's tolerance of                            previous anesthesia was also reviewed. The risks                            and benefits of the procedure and the sedation                            options and risks were discussed with the patient.                            All questions were answered, and informed consent                            was obtained. Prior Anticoagulants: The patient has                            taken no previous anticoagulant or antiplatelet                            agents. ASA Grade Assessment: II - A patient with                            mild systemic disease. After reviewing the risks                            and benefits, the patient was deemed in  satisfactory condition to undergo the procedure.                           After obtaining informed consent, the colonoscope                            was passed under direct vision. Throughout the                            procedure, the patient's blood pressure, pulse, and                            oxygen saturations were monitored continuously. The                             Colonoscope was introduced through the anus and                            advanced to the the cecum, identified by                            appendiceal orifice and ileocecal valve. The                            colonoscopy was performed without difficulty. The                            patient tolerated the procedure well. The quality                            of the bowel preparation was good. The ileocecal                            valve, appendiceal orifice, and rectum were                            photographed. Scope In: 1:39:49 PM Scope Out: 1:59:01 PM Scope Withdrawal Time: 0 hours 14 minutes 46 seconds  Total Procedure Duration: 0 hours 19 minutes 12 seconds  Findings:                 The perianal and digital rectal examinations were                            normal.                           A 3 mm polyp was found in the ascending colon. The                            polyp was sessile. The polyp was removed with a                            cold snare. Resection and retrieval were complete.  Three sessile polyps were found in the transverse                            colon. The polyps were 4 mm in size. These polyps                            were removed with a cold snare. Resection and                            retrieval were complete.                           A 5 mm polyp was found in the sigmoid colon. The                            polyp was sessile. The polyp was removed with a                            cold snare. Resection and retrieval were complete.                           Scattered medium-mouthed diverticula were found in                            the left colon.                           Internal hemorrhoids were found during retroflexion.                           The exam was otherwise without abnormality. Complications:            No immediate complications. Estimated blood loss:                             Minimal. Estimated Blood Loss:     Estimated blood loss was minimal. Impression:               - One 3 mm polyp in the ascending colon, removed                            with a cold snare. Resected and retrieved.                           - Three 4 mm polyps in the transverse colon,                            removed with a cold snare. Resected and retrieved.                           - One 5 mm polyp in the sigmoid colon, removed with                            a cold snare. Resected and  retrieved.                           - Diverticulosis in the left colon.                           - Internal hemorrhoids.                           - The examination was otherwise normal. Recommendation:           - Patient has a contact number available for                            emergencies. The signs and symptoms of potential                            delayed complications were discussed with the                            patient. Return to normal activities tomorrow.                            Written discharge instructions were provided to the                            patient.                           - Resume previous diet.                           - Continue present medications.                           - Await pathology results.                           - Repeat colonoscopy is recommended for                            surveillance in 3 years. Remo Lipps P. Armbruster MD, MD 01/05/2018 2:04:12 PM This report has been signed electronically.

## 2018-01-05 NOTE — Progress Notes (Signed)
Report given to PACU, vss 

## 2018-01-05 NOTE — Patient Instructions (Signed)
HANDOUTS GIVEN FOR DIVERTICULOSIS, POLYPS AND HEMORRHOIDS  YOU HAD AN ENDOSCOPIC PROCEDURE TODAY AT [facility]:   Refer to the procedure report that was given to you for any specific questions about what was found during the examination.  If the procedure report does not answer your questions, please call your gastroenterologist to clarify.  If you requested that your care partner not be given the details of your procedure findings, then the procedure report has been included in a sealed envelope for you to review at your convenience later.  YOU SHOULD EXPECT: Some feelings of bloating in the abdomen. Passage of more gas than usual.  Walking can help get rid of the air that was put into your GI tract during the procedure and reduce the bloating. If you had a lower endoscopy (such as a colonoscopy or flexible sigmoidoscopy) you may notice spotting of blood in your stool or on the toilet paper. If you underwent a bowel prep for your procedure, you may not have a normal bowel movement for a few days.  Please Note:  You might notice some irritation and congestion in your nose or some drainage.  This is from the oxygen used during your procedure.  There is no need for concern and it should clear up in a day or so.  SYMPTOMS TO REPORT IMMEDIATELY:   Following lower endoscopy (colonoscopy or flexible sigmoidoscopy):  Excessive amounts of blood in the stool  Significant tenderness or worsening of abdominal pains  Swelling of the abdomen that is new, acute  Fever of 100F or higher   For urgent or emergent issues, a gastroenterologist can be reached at any hour by calling [phone].   DIET:  We do recommend a small meal at first, but then you may proceed to your regular diet.  Drink plenty of fluids but you should avoid alcoholic beverages for 24 hours.  ACTIVITY:  You should plan to take it easy for the rest of today and you should NOT DRIVE or use heavy machinery until tomorrow  (because of the sedation medicines used during the test).    FOLLOW UP: Our staff will call the number listed on your records the next business day following your procedure to check on you and address any questions or concerns that you may have regarding the information given to you following your procedure. If we do not reach you, we will leave a message.  However, if you are feeling well and you are not experiencing any problems, there is no need to return our call.  We will assume that you have returned to your regular daily activities without incident.  If any biopsies were taken you will be contacted by phone or by letter within the next 1-3 weeks.  Please call us at [phone] if you have not heard about the biopsies in 3 weeks.    SIGNATURES/CONFIDENTIALITY: You and/or your care partner have signed paperwork which will be entered into your electronic medical record.  These signatures attest to the fact that that the information above on your After Visit Summary has been reviewed and is understood.  Full responsibility of the confidentiality of this discharge information lies with you and/or your care-partner.

## 2018-01-05 NOTE — Progress Notes (Signed)
Pt's states no medical or surgical changes since previsit or office visit. maw 

## 2018-01-05 NOTE — Progress Notes (Signed)
Called to room to assist during endoscopic procedure.  Patient ID and intended procedure confirmed with present staff. Received instructions for my participation in the procedure from the performing physician.  

## 2018-01-06 ENCOUNTER — Telehealth: Payer: Self-pay

## 2018-01-06 NOTE — Telephone Encounter (Signed)
  Follow up Call-  Call back number 01/05/2018 09/15/2016  Post procedure Call Back phone  # [phone] cell [phone]  Permission to leave phone message Yes Yes  Some recent data might be hidden     Patient questions:  Do you have a fever, pain , or abdominal swelling? No. Pain Score  0 *  Have you tolerated food without any problems? Yes.    Have you been able to return to your normal activities? Yes.    Do you have any questions about your discharge instructions: Diet   No. Medications  No. Follow up visit  No.  Do you have questions or concerns about your Care? No.  Actions: * If pain score is 4 or above: No action needed, pain <4.

## 2018-01-11 ENCOUNTER — Encounter: Payer: Self-pay | Admitting: Gastroenterology

## 2018-02-25 ENCOUNTER — Other Ambulatory Visit: Payer: Self-pay | Admitting: Family Medicine

## 2018-02-25 DIAGNOSIS — Z1231 Encounter for screening mammogram for malignant neoplasm of breast: Secondary | ICD-10-CM

## 2018-03-17 DIAGNOSIS — L570 Actinic keratosis: Secondary | ICD-10-CM | POA: Diagnosis not present

## 2018-03-17 DIAGNOSIS — Z85828 Personal history of other malignant neoplasm of skin: Secondary | ICD-10-CM | POA: Diagnosis not present

## 2018-03-17 DIAGNOSIS — L821 Other seborrheic keratosis: Secondary | ICD-10-CM | POA: Diagnosis not present

## 2018-03-17 DIAGNOSIS — D225 Melanocytic nevi of trunk: Secondary | ICD-10-CM | POA: Diagnosis not present

## 2018-03-18 DIAGNOSIS — H524 Presbyopia: Secondary | ICD-10-CM | POA: Diagnosis not present

## 2018-03-18 DIAGNOSIS — H2513 Age-related nuclear cataract, bilateral: Secondary | ICD-10-CM | POA: Diagnosis not present

## 2018-03-29 ENCOUNTER — Ambulatory Visit
Admission: RE | Admit: 2018-03-29 | Discharge: 2018-03-29 | Disposition: A | Discharge status: Home / Self Care | Payer: Medicare Other | Source: Ambulatory Visit | Attending: Family Medicine | Admitting: Family Medicine

## 2018-03-29 DIAGNOSIS — Z1231 Encounter for screening mammogram for malignant neoplasm of breast: Secondary | ICD-10-CM | POA: Diagnosis not present

## 2018-04-28 DIAGNOSIS — L810 Postinflammatory hyperpigmentation: Secondary | ICD-10-CM | POA: Diagnosis not present

## 2018-04-28 DIAGNOSIS — L570 Actinic keratosis: Secondary | ICD-10-CM | POA: Diagnosis not present

## 2018-05-14 DIAGNOSIS — H0014 Chalazion left upper eyelid: Secondary | ICD-10-CM | POA: Diagnosis not present

## 2018-07-08 ENCOUNTER — Ambulatory Visit: Payer: Medicare Other

## 2018-07-15 ENCOUNTER — Telehealth: Payer: Self-pay | Admitting: Family Medicine

## 2018-07-15 DIAGNOSIS — E7800 Pure hypercholesterolemia, unspecified: Secondary | ICD-10-CM

## 2018-07-15 DIAGNOSIS — Z0000 Encounter for general adult medical examination without abnormal findings: Secondary | ICD-10-CM

## 2018-07-15 NOTE — Telephone Encounter (Signed)
-----   Message from Eustace Pen, LPN sent at 8/67/6720 12:19 PM EDT ----- Regarding: Labs 8/30 Lab orders needed. Thank you.  Insurance:  [facility] Medicare

## 2018-07-16 ENCOUNTER — Encounter: Payer: Self-pay | Admitting: Family Medicine

## 2018-07-16 ENCOUNTER — Ambulatory Visit (INDEPENDENT_AMBULATORY_CARE_PROVIDER_SITE_OTHER): Payer: Medicare Other

## 2018-07-16 ENCOUNTER — Ambulatory Visit (INDEPENDENT_AMBULATORY_CARE_PROVIDER_SITE_OTHER): Payer: Medicare Other | Admitting: Family Medicine

## 2018-07-16 VITALS — BP 138/84 | HR 75 | Temp 98.6°F | Ht 63.0 in | Wt 158.5 lb

## 2018-07-16 DIAGNOSIS — Z1211 Encounter for screening for malignant neoplasm of colon: Secondary | ICD-10-CM

## 2018-07-16 DIAGNOSIS — Z0000 Encounter for general adult medical examination without abnormal findings: Secondary | ICD-10-CM

## 2018-07-16 DIAGNOSIS — E7800 Pure hypercholesterolemia, unspecified: Secondary | ICD-10-CM

## 2018-07-16 LAB — COMPREHENSIVE METABOLIC PANEL
ALBUMIN: 4.4 g/dL (ref 3.5–5.2)
ALT: 12 U/L (ref 0–35)
AST: 12 U/L (ref 0–37)
Alkaline Phosphatase: 81 U/L (ref 39–117)
BUN: 17 mg/dL (ref 6–23)
CO2: 31 mEq/L (ref 19–32)
Calcium: 10.1 mg/dL (ref 8.4–10.5)
Chloride: 98 mEq/L (ref 96–112)
Creatinine, Ser: 0.83 mg/dL (ref 0.40–1.20)
GFR: 70.89 mL/min (ref >60.00)
Glucose, Bld: 106 mg/dL — ABNORMAL HIGH (ref 70–99)
POTASSIUM: 3.6 meq/L (ref 3.5–5.1)
Sodium: 135 mEq/L (ref 135–145)
TOTAL PROTEIN: 7.8 g/dL (ref 6.0–8.3)
Total Bilirubin: 0.5 mg/dL (ref 0.2–1.2)

## 2018-07-16 LAB — CBC WITH DIFFERENTIAL/PLATELET
Basophils Absolute: 0.1 10*3/uL (ref 0.0–0.1)
Basophils Relative: 0.8 % (ref 0.0–3.0)
EOS PCT: 2.7 % (ref 0.0–5.0)
Eosinophils Absolute: 0.2 10*3/uL (ref 0.0–0.7)
HEMATOCRIT: 39 % (ref 36.0–46.0)
HEMOGLOBIN: 13.2 g/dL (ref 12.0–15.0)
LYMPHS PCT: 30.3 % (ref 12.0–46.0)
Lymphs Abs: 1.9 10*3/uL (ref 0.7–4.0)
MCHC: 33.8 g/dL (ref 30.0–36.0)
MCV: 91.5 fl (ref 78.0–100.0)
MONOS PCT: 9.1 % (ref 3.0–12.0)
Monocytes Absolute: 0.6 10*3/uL (ref 0.1–1.0)
Neutro Abs: 3.6 10*3/uL (ref 1.4–7.7)
Neutrophils Relative %: 57.1 % (ref 43.0–77.0)
Platelets: 349 10*3/uL (ref 150.0–400.0)
RBC: 4.26 Mil/uL (ref 3.87–5.11)
RDW: 14.3 % (ref 11.5–15.5)
WBC: 6.3 10*3/uL (ref 4.0–10.5)

## 2018-07-16 LAB — LIPID PANEL
CHOLESTEROL: 307 mg/dL — AB (ref 0–200)
HDL: 46.1 mg/dL (ref >39.00)
Total CHOL/HDL Ratio: 7

## 2018-07-16 LAB — TSH: TSH: 1.82 u[IU]/mL (ref 0.35–4.50)

## 2018-07-16 LAB — LDL CHOLESTEROL, DIRECT: LDL DIRECT: 164 mg/dL

## 2018-07-16 NOTE — Assessment & Plan Note (Signed)
Reviewed health habits including diet and exercise and skin cancer prevention Reviewed appropriate screening tests for age  Also reviewed health mt list, fam hx and immunization status , as well as social and family history   See HPI amw reviewed  Labs drawn  Failed hearing screen-declines addn w/u or hearing aides right now Declines dexa (no falls or fx)-enc vit D and walking Declines any imms but flu shot (asked her to update if she changes her mind) Disc low sat/trans fat diet

## 2018-07-16 NOTE — Patient Instructions (Signed)
Connie Bell , Thank you for taking time to come for your Medicare Wellness Visit. I appreciate your ongoing commitment to your health goals. Please review the following plan we discussed and let me know if I can assist you in the future.   These are the goals we discussed: Goals    . Increase physical activity     Starting 07/16/2018, I will continue to walk 60 minutes 3 days per week.        This is a list of the screening recommended for you and due dates:  Health Maintenance  Topic Date Due  . Flu Shot  02/16/2019*  . Tetanus Vaccine  12/19/2020*  . Pneumonia vaccines (1 of 2 - PCV13) 12/20/2023*  . Mammogram  03/30/2019  . Colon Cancer Screening  01/05/2021  . DEXA scan (bone density measurement)  Completed  *Topic was postponed. The date shown is not the original due date.   Preventive Care for Adults  A healthy lifestyle and preventive care can promote health and wellness. Preventive health guidelines for adults include the following key practices.  . A routine yearly physical is a good way to check with your health care provider about your health and preventive screening. It is a chance to share any concerns and updates on your health and to receive a thorough exam.  . Visit your dentist for a routine exam and preventive care every 6 months. Brush your teeth twice a day and floss once a day. Good oral hygiene prevents tooth decay and gum disease.  . The frequency of eye exams is based on your age, health, family medical history, use  of contact lenses, and other factors. Follow your health care provider's recommendations for frequency of eye exams.  . Eat a healthy diet. Foods like vegetables, fruits, whole grains, low-fat dairy products, and lean protein foods contain the nutrients you need without too many calories. Decrease your intake of foods high in solid fats, added sugars, and salt. Eat the right amount of calories for you. Get information about a proper diet from your  health care provider, if necessary.  . Regular physical exercise is one of the most important things you can do for your health. Most adults should get at least 150 minutes of moderate-intensity exercise (any activity that increases your heart rate and causes you to sweat) each week. In addition, most adults need muscle-strengthening exercises on 2 or more days a week.  Silver Sneakers may be a benefit available to you. To determine eligibility, you may visit the website: www.silversneakers.com or contact program at [phone] Mon-Fri between 8AM-8PM.   . Maintain a healthy weight. The body mass index (BMI) is a screening tool to identify possible weight problems. It provides an estimate of body fat based on height and weight. Your health care provider can find your BMI and can help you achieve or maintain a healthy weight.   For adults 20 years and older: ? A BMI below 18.5 is considered underweight. ? A BMI of 18.5 to 24.9 is normal. ? A BMI of 25 to 29.9 is considered overweight. ? A BMI of 30 and above is considered obese.   . Maintain normal blood lipids and cholesterol levels by exercising and minimizing your intake of saturated fat. Eat a balanced diet with plenty of fruit and vegetables. Blood tests for lipids and cholesterol should begin at age 46 and be repeated every 5 years. If your lipid or cholesterol levels are high, you are over 50,  or you are at high risk for heart disease, you may need your cholesterol levels checked more frequently. Ongoing high lipid and cholesterol levels should be treated with medicines if diet and exercise are not working.  . If you smoke, find out from your health care provider how to quit. If you do not use tobacco, please do not start.  . If you choose to drink alcohol, please do not consume more than 2 drinks per day. One drink is considered to be 12 ounces (355 mL) of beer, 5 ounces (148 mL) of wine, or 1.5 ounces (44 mL) of liquor.  . If you are  17-69 years old, ask your health care provider if you should take aspirin to prevent strokes.  . Use sunscreen. Apply sunscreen liberally and repeatedly throughout the day. You should seek shade when your shadow is shorter than you. Protect yourself by wearing long sleeves, pants, a wide-brimmed hat, and sunglasses year round, whenever you are outdoors.  . Once a month, do a whole body skin exam, using a mirror to look at the skin on your back. Tell your health care provider of new moles, moles that have irregular borders, moles that are larger than a pencil eraser, or moles that have changed in shape or color.

## 2018-07-16 NOTE — Progress Notes (Signed)
Subjective:   Connie Bell is a 77 y.o. female who presents for Medicare Annual (Subsequent) preventive examination.  Review of Systems:  N/A Cardiac Risk Factors include: advanced age (>57men, >63 women);dyslipidemia     Objective:     Vitals: BP 138/84 (BP Location: Right Arm, Patient Position: Sitting, Cuff Size: Normal)   Pulse 75   Temp 98.6 F (37 C) (Oral)   Ht 5\' 3"  (1.6 m) Comment: no shoes  Wt 158 lb 8 oz (71.9 kg)   SpO2 96%   BMI 28.08 kg/m   Body mass index is 28.08 kg/m.  Advanced Directives 07/16/2018 07/07/2017 09/15/2016 08/25/2014 08/19/2014  Does Patient Have a Medical Advance Directive? - No No Yes No;Yes  Type of Advance Directive [place] will Living will  Does patient want to make changes to medical advance directive? - - - No - Patient declined -  Copy of [place] in Chart? No - copy requested - - No - copy requested -  Would patient like information on creating a medical advance directive? - - - No - patient declined information No - patient declined information    Tobacco Social History   Tobacco Use  Smoking Status Never Smoker  Smokeless Tobacco Never Used     Counseling given: No   Clinical Intake:  Pre-visit preparation completed: Yes  Pain : No/denies pain Pain Score: 0-No pain     Nutritional Status: BMI 25 -29 Overweight Nutritional Risks: None Diabetes: No  How often do you need to have someone help you when you read instructions, pamphlets, or other written materials from your doctor or pharmacy?: 1 - Never What is the last grade level you completed in school?: 12th grade  Interpreter Needed?: No  Comments: pt is a widow and lives alone Information entered by :: LPinson, LPN  Past Medical History:  Diagnosis Date  . Depression   . Hyperlipemia    borderline not on Rx.  . Osteoporosis   . Rosacea   . Skin cancer    on nose and neck 2 times/basal cell   Past  Surgical History:  Procedure Laterality Date  . COLONOSCOPY    . HEMORRHOID SURGERY     Pt passed out past surgery due to pain!  Marland Kitchen PARTIAL HYSTERECTOMY     secondary to abn pap  . Patient refuses     mammograms, chol treatment and vaccines  . POLYPECTOMY     Family History  Problem Relation Age of Onset  . Heart disease Mother        MI  . Cancer Sister 73       lung cancer  . Lung cancer Sister   . Colon cancer Brother 63       died at 36  . Heart disease Father        CHF  . Stomach cancer Maternal Aunt   . Colon polyps Neg Hx   . Esophageal cancer Neg Hx   . Rectal cancer Neg Hx   . Breast cancer Neg Hx    Social History   Socioeconomic History  . Marital status: Widowed    Spouse name: Not on file  . Number of children: 2  . Years of education: Not on file  . Highest education level: Not on file  Occupational History  . Occupation: Retired    Fish farm manager: RETIRED  Social Needs  . Financial resource strain: Not on file  . Food  insecurity:    Worry: Not on file    Inability: Not on file  . Transportation needs:    Medical: Not on file    Non-medical: Not on file  Tobacco Use  . Smoking status: Never Smoker  . Smokeless tobacco: Never Used  Substance and Sexual Activity  . Alcohol use: No  . Drug use: No  . Sexual activity: Not on file  Lifestyle  . Physical activity:    Days per week: Not on file    Minutes per session: Not on file  . Stress: Not on file  Relationships  . Social connections:    Talks on phone: Not on file    Gets together: Not on file    Attends religious service: Not on file    Active member of club or organization: Not on file    Attends meetings of clubs or organizations: Not on file    Relationship status: Not on file  Other Topics Concern  . Not on file  Social History Narrative   Widowed- husband was suicide   Children; twins    Outpatient Encounter Medications as of 07/16/2018  Medication Sig  . Cholecalciferol (VITAMIN  D PO) Take 400 Units by mouth daily.  . Multiple Vitamins-Minerals (VITAMIN D3 COMPLETE PO) Take 100 mcg by mouth daily.  . NON FORMULARY Doterra on Guard-Take one daily  . Omega-3 Fatty Acids (FISH OIL) 1000 MG CAPS Take 1pill by mouth daily  . [DISCONTINUED] Garlic 725 MG TABS Take 1 tablet by mouth daily.   Facility-Administered Encounter Medications as of 07/16/2018  Medication  . 0.9 %  sodium chloride infusion  . 0.9 %  sodium chloride infusion    Activities of Daily Living In your present state of health, do you have any difficulty performing the following activities: 07/16/2018  Hearing? N  Vision? N  Difficulty concentrating or making decisions? N  Walking or climbing stairs? N  Dressing or bathing? N  Doing errands, shopping? N  Preparing Food and eating ? N  Using the Toilet? N  In the past six months, have you accidently leaked urine? N  Do you have problems with loss of bowel control? N  Managing your Medications? N  Managing your Finances? N  Housekeeping or managing your Housekeeping? N  Some recent data might be hidden    Patient Care Team: Tower, Wynelle Fanny, MD as PCP - General    Assessment:   This is a routine wellness examination for Shylah.   Hearing Screening   125Hz  250Hz  500Hz  1000Hz  2000Hz  3000Hz  4000Hz  6000Hz  8000Hz   Right ear:   0 0 0  0    Left ear:   40 0 40  0    Vision Screening Comments: Vision exam in June 2019 with Dr. Rande Brunt  Exercise Activities and Dietary recommendations Current Exercise Habits: Home exercise routine, Type of exercise: walking, Time (Minutes): 60, Frequency (Times/Week): 3, Weekly Exercise (Minutes/Week): 180, Intensity: Mild, Exercise limited by: None identified  Goals    . Increase physical activity     Starting 07/16/2018, I will continue to walk 60 minutes 3 days per week.        Fall Risk Fall Risk  07/16/2018 07/07/2017  Falls in the past year? No No   Depression Screen PHQ 2/9 Scores 07/16/2018 07/07/2017    PHQ - 2 Score 0 1  PHQ- 9 Score 0 1     Cognitive Function MMSE - Mini Mental State Exam 07/16/2018 07/07/2017  Orientation  to time 5 5  Orientation to Place 5 5  Registration 3 3  Attention/ Calculation 0 0  Recall 3 2  Recall-comments - pt was unable to recall 1 of 3 words  Language- name 2 objects 0 0  Language- repeat 1 1  Language- follow 3 step command 3 3  Language- read & follow direction 0 0  Write a sentence 0 0  Copy design 0 0  Total score 20 19     PLEASE NOTE: A Mini-Cog screen was completed. Maximum score is 20. A value of 0 denotes this part of Folstein MMSE was not completed or the patient failed this part of the Mini-Cog screening.   Mini-Cog Screening Orientation to Time - Max 5 pts Orientation to Place - Max 5 pts Registration - Max 3 pts Recall - Max 3 pts Language Repeat - Max 1 pts Language Follow 3 Step Command - Max 3 pts     Immunization History  Administered Date(s) Administered  . Influenza, High Dose Seasonal PF 09/10/2017  . Influenza,inj,Quad PF,6+ Mos 08/26/2013, 07/14/2016  . Influenza-Unspecified 08/17/2014  . PPD Test 07/20/2013  . Td 11/17/1989    Screening Tests Health Maintenance  Topic Date Due  . INFLUENZA VACCINE  02/16/2019 (Originally 06/17/2018)  . TETANUS/TDAP  12/19/2020 (Originally 11/18/1999)  . PNA vac Low Risk Adult (1 of 2 - PCV13) 12/20/2023 (Originally 09/26/2006)  . MAMMOGRAM  03/30/2019  . COLONOSCOPY  01/05/2021  . DEXA SCAN  Completed      Plan:     I have personally reviewed, addressed, and noted the following in the patient's chart:  A. Medical and social history B. Use of alcohol, tobacco or illicit drugs  C. Current medications and supplements D. Functional ability and status E.  Nutritional status F.  Physical activity G. Advance directives H. List of other physicians I.  Hospitalizations, surgeries, and ER visits in previous 12 months J.  [place] to include hearing, vision,  cognitive, depression L. Referrals and appointments - none  In addition, I have reviewed and discussed with patient certain preventive protocols, quality metrics, and best practice recommendations. A written personalized care plan for preventive services as well as general preventive health recommendations were provided to patient.  See attached scanned questionnaire for additional information.   Signed,   Lindell Noe, MHA, BS, LPN Health Coach   Lindell Noe, [place]  1/61/0960

## 2018-07-16 NOTE — Patient Instructions (Signed)
Don't forget flu shot in the fall   For cholesterol Avoid red meat/ fried foods/ egg yolks/ fatty breakfast meats/ butter, cheese and high fat dairy/ and shellfish    If you change your mind about immunizations let us know   Take care of yourself  Keep walking and stay active

## 2018-07-16 NOTE — Assessment & Plan Note (Signed)
Disc goals for lipids and reasons to control them Rev last labs with pt Rev low sat fat diet in detail Labs today still pending  Per pt-diet is stable Info given re: low cholesterol diet

## 2018-07-16 NOTE — Assessment & Plan Note (Signed)
Colonoscopy 2/19 - adenomatous polyp in fragments  3 y f/u  Also family hx of colon cancer

## 2018-07-16 NOTE — Progress Notes (Signed)
PCP notes:   Health maintenance:  Flu vaccine - addressed  Abnormal screenings:   Hearing - failed  Hearing Screening   125Hz  250Hz  500Hz  1000Hz  2000Hz  3000Hz  4000Hz  6000Hz  8000Hz   Right ear:   0 0 0  0    Left ear:   40 0 40  0     Patient concerns:   None  Nurse concerns:  None  Next PCP appt:   07/16/2018 @ 1500  I reviewed health advisor's note, was available for consultation, and agree with documentation and plan. Loura Pardon MD

## 2018-07-16 NOTE — Progress Notes (Signed)
Subjective:    Patient ID: Connie Bell, female    DOB: 10-07-1941, 77 y.o.   MRN: [id number]  HPI Here for health maintenance exam and to review chronic medical problems    Hard summer  Had stings/bites and poison ivy Was outside a lot   Had amw today  Failed hearing screen -not interested in hearing aides/ does not limit her yes   Wt Readings from Last 3 Encounters:  07/16/18 158 lb 8 oz (71.9 kg)  07/16/18 158 lb 8 oz (71.9 kg)  01/05/18 162 lb (73.5 kg)  stable weight  Eats fairly healthy overall / not a lot of junk food  Exercise- walks regularly 3 days per week  28.08 kg/m   Flu shot -plans to get one this year   Does not get pneumonia vaccines   Mammogram 5/19 nl Self breast exam -no breast lumps or changes   Colonoscopy 2/19 - 3y recall for polyp/adenoma fragments   dexa 2/05 Not interested in another  Takes vitamin D for bones No falls or fractures   Zoster status -not interested   BP BP Readings from Last 3 Encounters:  07/16/18 138/84  07/16/18 138/84  01/05/18 (!) 143/71   Dermatology -has followed up (basal cell in the past), will f/u again in the fall  Did treat a spot on the nose  Good about sunscreen   Hyperlipidemia Lab Results  Component Value Date   CHOL 246 (H) 07/07/2017   HDL 42.70 07/07/2017   LDLDIRECT 140.0 07/07/2017   TRIG 392.0 (H) 07/07/2017   CHOLHDL 6 07/07/2017    Had labs drawn today-pending results   Patient Active Problem List   Diagnosis Date Noted  . Routine general medical examination at a health care facility 07/07/2017  . Heme positive stool 08/08/2016  . Colon cancer screening 07/14/2016  . Family history of colon cancer 07/14/2016  . Pre-employment examination 08/26/2013  . Hemorrhoids 10/28/2011  . HEMORRHOIDS, INTERNAL 01/08/2011  . HYPERCHOLESTEROLEMIA 01/12/2008  . DEPRESSION 01/12/2008  . ROSACEA 01/12/2008   Past Medical History:  Diagnosis Date  . Depression   . Hyperlipemia    borderline not on Rx.  . Osteoporosis   . Rosacea   . Skin cancer    on nose and neck 2 times/basal cell   Past Surgical History:  Procedure Laterality Date  . COLONOSCOPY    . HEMORRHOID SURGERY     Pt passed out past surgery due to pain!  Marland Kitchen PARTIAL HYSTERECTOMY     secondary to abn pap  . Patient refuses     mammograms, chol treatment and vaccines  . POLYPECTOMY     Social History   Tobacco Use  . Smoking status: Never Smoker  . Smokeless tobacco: Never Used  Substance Use Topics  . Alcohol use: No  . Drug use: No   Family History  Problem Relation Age of Onset  . Heart disease Mother        MI  . Cancer Sister 39       lung cancer  . Lung cancer Sister   . Colon cancer Brother 43       died at 84  . Heart disease Father        CHF  . Stomach cancer Maternal Aunt   . Colon polyps Neg Hx   . Esophageal cancer Neg Hx   . Rectal cancer Neg Hx   . Breast cancer Neg Hx    Allergies  Allergen Reactions  .  Alendronate Sodium     REACTION: depressed, headaches, stomach aches  . Morphine     REACTION: /whelts  . Atorvastatin     REACTION: pt does not remember   Current Outpatient Medications on File Prior to Visit  Medication Sig Dispense Refill  . Multiple Vitamins-Minerals (VITAMIN D3 COMPLETE PO) Take 100 mcg by mouth daily.    . NON FORMULARY Doterra on Guard-Take one daily    . Omega-3 Fatty Acids (FISH OIL) 1000 MG CAPS Take 1pill by mouth daily     Current Facility-Administered Medications on File Prior to Visit  Medication Dose Route Frequency Provider Last Rate Last Dose  . 0.9 %  sodium chloride infusion  500 mL Intravenous Continuous Armbruster, Carlota Raspberry, MD      . 0.9 %  sodium chloride infusion  500 mL Intravenous Once Armbruster, Carlota Raspberry, MD        Review of Systems  Constitutional: Negative for activity change, appetite change, fatigue, fever and unexpected weight change.  HENT: Positive for hearing loss. Negative for congestion, ear  discharge, ear pain, rhinorrhea, sinus pressure and sore throat.   Eyes: Negative for pain, redness and visual disturbance.  Respiratory: Negative for cough, shortness of breath and wheezing.   Cardiovascular: Negative for chest pain and palpitations.  Gastrointestinal: Negative for abdominal pain, blood in stool, constipation and diarrhea.  Endocrine: Negative for polydipsia and polyuria.  Genitourinary: Negative for dysuria, frequency and urgency.  Musculoskeletal: Negative for arthralgias, back pain and myalgias.  Skin: Negative for pallor and rash.  Allergic/Immunologic: Negative for environmental allergies.  Neurological: Negative for dizziness, syncope and headaches.  Hematological: Negative for adenopathy. Does not bruise/bleed easily.  Psychiatric/Behavioral: Negative for decreased concentration and dysphoric mood. The patient is not nervous/anxious.        Objective:   Physical Exam  Constitutional: She appears well-developed and well-nourished. No distress.  HENT:  Head: Normocephalic and atraumatic.  Right Ear: External ear normal.  Left Ear: External ear normal.  Nose: Nose normal.  Mouth/Throat: Oropharynx is clear and moist.  Mild cerumen bilat ear canals   Eyes: Pupils are equal, round, and reactive to light. Conjunctivae and EOM are normal. No scleral icterus.  Neck: Normal range of motion. Neck supple. No JVD present. Carotid bruit is not present. No thyromegaly present.  Cardiovascular: Normal rate, regular rhythm, normal heart sounds and intact distal pulses. Exam reveals no gallop.  Pulmonary/Chest: Effort normal and breath sounds normal. No respiratory distress. She has no wheezes. She exhibits no tenderness. No breast tenderness, discharge or bleeding.  Abdominal: Soft. Bowel sounds are normal. She exhibits no distension, no abdominal bruit and no mass. There is no tenderness.  Genitourinary: No breast tenderness, discharge or bleeding.  Genitourinary Comments:  Breast exam: No mass, nodules, thickening, tenderness, bulging, retraction, inflamation, nipple discharge or skin changes noted.  No axillary or clavicular LA.      Musculoskeletal: Normal range of motion. She exhibits no edema or tenderness.  No kyphosis   Lymphadenopathy:    She has no cervical adenopathy.  Neurological: She is alert. She has normal reflexes. She displays normal reflexes. No cranial nerve deficit. She exhibits normal muscle tone. Coordination normal.  Skin: Skin is warm and dry. No rash noted. No erythema. No pallor.  Solar lentigines diffusely  Some sks  Recently tx AK on nose    Psychiatric: She has a normal mood and affect. Cognition and memory are normal.  Pleasant and mentally sharp  Assessment & Plan:   Problem List Items Addressed This Visit      Other   Colon cancer screening    Colonoscopy 2/19 - adenomatous polyp in fragments  3 y f/u  Also family hx of colon cancer       HYPERCHOLESTEROLEMIA    Disc goals for lipids and reasons to control them Rev last labs with pt Rev low sat fat diet in detail Labs today still pending  Per pt-diet is stable Info given re: low cholesterol diet       Routine general medical examination at a health care facility - Primary    Reviewed health habits including diet and exercise and skin cancer prevention Reviewed appropriate screening tests for age  Also reviewed health mt list, fam hx and immunization status , as well as social and family history   See HPI amw reviewed  Labs drawn  Failed hearing screen-declines addn w/u or hearing aides right now Declines dexa (no falls or fx)-enc vit D and walking Declines any imms but flu shot (asked her to update if she changes her mind) Disc low sat/trans fat diet

## 2018-07-18 ENCOUNTER — Encounter: Payer: Self-pay | Admitting: Family Medicine

## 2018-07-18 DIAGNOSIS — R7309 Other abnormal glucose: Secondary | ICD-10-CM | POA: Insufficient documentation

## 2018-07-20 ENCOUNTER — Encounter: Payer: Self-pay | Admitting: *Deleted

## 2018-08-19 ENCOUNTER — Ambulatory Visit (INDEPENDENT_AMBULATORY_CARE_PROVIDER_SITE_OTHER): Payer: Medicare Other

## 2018-08-19 DIAGNOSIS — Z23 Encounter for immunization: Secondary | ICD-10-CM | POA: Diagnosis not present

## 2018-11-20 DIAGNOSIS — J40 Bronchitis, not specified as acute or chronic: Secondary | ICD-10-CM | POA: Diagnosis not present

## 2018-11-23 ENCOUNTER — Encounter: Payer: Self-pay | Admitting: Family Medicine

## 2018-11-23 ENCOUNTER — Ambulatory Visit (INDEPENDENT_AMBULATORY_CARE_PROVIDER_SITE_OTHER): Payer: Medicare Other | Admitting: Family Medicine

## 2018-11-23 ENCOUNTER — Telehealth: Payer: Self-pay | Admitting: *Deleted

## 2018-11-23 VITALS — BP 180/80 | HR 77 | Temp 98.1°F | Wt 159.0 lb

## 2018-11-23 DIAGNOSIS — R030 Elevated blood-pressure reading, without diagnosis of hypertension: Secondary | ICD-10-CM

## 2018-11-23 DIAGNOSIS — J209 Acute bronchitis, unspecified: Secondary | ICD-10-CM

## 2018-11-23 DIAGNOSIS — I10 Essential (primary) hypertension: Secondary | ICD-10-CM | POA: Insufficient documentation

## 2018-11-23 MED ORDER — AMLODIPINE BESYLATE 5 MG PO TABS: 5.0000 mg | ORAL_TABLET | Freq: Every day | ORAL | 3 refills | Status: DC

## 2018-11-23 NOTE — Assessment & Plan Note (Signed)
Recent tx with medrol from [facility] on 1/4  Much clinical improvement and reassuring exam  However-has elevated bp and headache -see A/P for that

## 2018-11-23 NOTE — Patient Instructions (Signed)
Finish your antibiotic and steroid   Take amlodipine for blood pressure  5 mg once daily  Follow up with me in 10-14 days   If worse headache or other symptoms please let me know  Watch diet for sodium/processed foods

## 2018-11-23 NOTE — Assessment & Plan Note (Signed)
BP: (!) 180/80    With headache  Walked in today  Suspect due to prednisone and recent illness (bronchitis) While she is opposed in general to medication or treatment of chronic conditions-was agreeable today for one time px of amlodipine to get bp down while on steroid  She is most interested in resolving the headache Close f/u (appt scheduled) Enc DASH eating in light of high bp

## 2018-11-23 NOTE — Telephone Encounter (Signed)
If she is feeling better she does not need one (I think she was)  However if cough worsens or if fever or other symptoms we will send one in Thanks for letting me know! Easy to get confused about that

## 2018-11-23 NOTE — Telephone Encounter (Signed)
Left VM for daughter letting her know Dr. Marliss Coots comments and to call back if she thinks pt needs abx due to the sxs listed in Dr. Marliss Coots message

## 2018-11-23 NOTE — Telephone Encounter (Signed)
Patient's daughter Rachel Moulds) called stating that she wanted to let Dr. Glori Bickers know that her mom was confused and is not on an antibiotic. Rachel Moulds stated that her mom was put on a steroid dose pak and cough medication. Rachel Moulds stated that her mom was given another blood pressure medication today and wants to know if Dr. Glori Bickers feels that she needs an antibiotic for her bronchitis?  Rachel Moulds stated that if she needs an antibiotic to send it to the pharmacy and she will pick both medications up at the same time. [facility]

## 2018-11-23 NOTE — Progress Notes (Signed)
Subjective:    Patient ID: Connie Bell, female    DOB: 05/31/1941, 78 y.o.   MRN: [id number]  HPI  Walked in with elevated bp today in setting of recent uri/bronchitis ([facility] on 1/[facility]) On zpak and cough med (I do not see zpak on med list however- ? If correct) Was px medrol  States her bronchitis symptoms are much improved BP is high - this concerned family (daughter is here) and she has a headache   Wt Readings from Last 3 Encounters:  11/23/18 159 lb (72.1 kg)  07/16/18 158 lb 8 oz (71.9 kg)  07/16/18 158 lb 8 oz (71.9 kg)   28.17 kg/m  BP Readings from Last 3 Encounters:  11/23/18 (!) 178/96  07/16/18 138/84  07/16/18 138/84   Pulse Readings from Last 3 Encounters:  11/23/18 77  07/16/18 75  07/16/18 75   Pulse ox 98%   Lab Results  Component Value Date   CREATININE 0.83 07/16/2018   BUN 17 07/16/2018   NA 135 07/16/2018   K 3.6 07/16/2018   CL 98 07/16/2018   CO2 31 07/16/2018   Lab Results  Component Value Date   CHOL 307 (H) 07/16/2018   HDL 46.10 07/16/2018   LDLDIRECT 164.0 07/16/2018   TRIG (H) 07/16/2018    469.0 Triglyceride is over 400; calculations on Lipids are invalid.   CHOLHDL 7 07/16/2018   Pt historically declines medications/screening and health mt as well as imms  Patient Active Problem List   Diagnosis Date Noted  . Transient elevated blood pressure 11/23/2018  . Elevated glucose level 07/18/2018  . Routine general medical examination at a health care facility 07/07/2017  . Heme positive stool 08/08/2016  . Colon cancer screening 07/14/2016  . Family history of colon cancer 07/14/2016  . Pre-employment examination 08/26/2013  . Hemorrhoids 10/28/2011  . HEMORRHOIDS, INTERNAL 01/08/2011  . HYPERCHOLESTEROLEMIA 01/12/2008  . DEPRESSION 01/12/2008  . ROSACEA 01/12/2008   Past Medical History:  Diagnosis Date  . Depression   . Hyperlipemia    borderline not on Rx.  . Osteoporosis   . Rosacea   . Skin cancer    on nose and  neck 2 times/basal cell   Past Surgical History:  Procedure Laterality Date  . COLONOSCOPY    . HEMORRHOID SURGERY     Pt passed out past surgery due to pain!  Marland Kitchen PARTIAL HYSTERECTOMY     secondary to abn pap  . Patient refuses     mammograms, chol treatment and vaccines  . POLYPECTOMY     Social History   Tobacco Use  . Smoking status: Never Smoker  . Smokeless tobacco: Never Used  Substance Use Topics  . Alcohol use: No  . Drug use: No   Family History  Problem Relation Age of Onset  . Heart disease Mother        MI  . Cancer Sister 57       lung cancer  . Lung cancer Sister   . Colon cancer Brother 26       died at 42  . Heart disease Father        CHF  . Stomach cancer Maternal Aunt   . Colon polyps Neg Hx   . Esophageal cancer Neg Hx   . Rectal cancer Neg Hx   . Breast cancer Neg Hx    Allergies  Allergen Reactions  . Alendronate Sodium     REACTION: depressed, headaches, stomach aches  . Morphine  REACTION: /whelts  . Atorvastatin     REACTION: pt does not remember   Current Outpatient Medications on File Prior to Visit  Medication Sig Dispense Refill  . Cholecalciferol (VITAMIN D PO) Take 400 Units by mouth daily.    . Multiple Vitamins-Minerals (VITAMIN D3 COMPLETE PO) Take 100 mcg by mouth daily.    . NON FORMULARY Doterra on Guard-Take one daily    . Omega-3 Fatty Acids (FISH OIL) 1000 MG CAPS Take 1pill by mouth daily     Current Facility-Administered Medications on File Prior to Visit  Medication Dose Route Frequency Provider Last Rate Last Dose  . 0.9 %  sodium chloride infusion  500 mL Intravenous Continuous Armbruster, Carlota Raspberry, MD      . 0.9 %  sodium chloride infusion  500 mL Intravenous Once Armbruster, Carlota Raspberry, MD        Review of Systems  Constitutional: Negative for activity change, appetite change, fatigue, fever and unexpected weight change.  HENT: Negative for congestion, ear pain, rhinorrhea, sinus pressure and sore throat.     Eyes: Negative for pain, redness and visual disturbance.  Respiratory: Positive for cough. Negative for chest tightness, shortness of breath and wheezing.   Cardiovascular: Negative for chest pain and palpitations.  Gastrointestinal: Negative for abdominal pain, blood in stool, constipation and diarrhea.  Endocrine: Negative for polydipsia and polyuria.  Genitourinary: Negative for dysuria, frequency and urgency.  Musculoskeletal: Negative for arthralgias, back pain and myalgias.  Skin: Negative for pallor and rash.  Allergic/Immunologic: Negative for environmental allergies.  Neurological: Positive for headaches. Negative for dizziness, seizures, syncope, speech difficulty, light-headedness and numbness.  Hematological: Negative for adenopathy. Does not bruise/bleed easily.  Psychiatric/Behavioral: Negative for decreased concentration and dysphoric mood. The patient is not nervous/anxious.        Objective:   Physical Exam Constitutional:      General: She is not in acute distress.    Appearance: Normal appearance. She is not ill-appearing or diaphoretic.  HENT:     Head: Normocephalic and atraumatic.     Nose: Congestion present.     Mouth/Throat:     Mouth: Mucous membranes are moist.     Pharynx: Oropharynx is clear. No posterior oropharyngeal erythema.  Eyes:     Extraocular Movements: Extraocular movements intact.     Conjunctiva/sclera: Conjunctivae normal.     Pupils: Pupils are equal, round, and reactive to light.  Neck:     Musculoskeletal: No neck rigidity.     Vascular: No carotid bruit.  Cardiovascular:     Rate and Rhythm: Normal rate and regular rhythm.     Pulses: Normal pulses.     Heart sounds: No murmur.  Pulmonary:     Effort: No respiratory distress.     Breath sounds: No stridor. No wheezing, rhonchi or rales.     Comments: Harsh bs Nl air exch No wheeze or rales  Chest:     Chest wall: No tenderness.  Abdominal:     Comments: No renal bruit    Lymphadenopathy:     Cervical: No cervical adenopathy.  Skin:    General: Skin is warm and dry.     Coloration: Skin is not pale.  Neurological:     General: No focal deficit present.     Mental Status: She is alert. Mental status is at baseline.  Psychiatric:        Mood and Affect: Mood normal.  Assessment & Plan:   Problem List Items Addressed This Visit      Respiratory   Acute bronchitis    Recent tx with medrol from [facility] on 1/4  Much clinical improvement and reassuring exam  However-has elevated bp and headache -see A/P for that        Other   Transient elevated blood pressure - Primary    BP: (!) 180/80    With headache  Walked in today  Suspect due to prednisone and recent illness (bronchitis) While she is opposed in general to medication or treatment of chronic conditions-was agreeable today for one time px of amlodipine to get bp down while on steroid  She is most interested in resolving the headache Close f/u (appt scheduled) Enc DASH eating in light of high bp

## 2018-12-07 ENCOUNTER — Encounter: Payer: Self-pay | Admitting: Family Medicine

## 2018-12-07 ENCOUNTER — Ambulatory Visit (INDEPENDENT_AMBULATORY_CARE_PROVIDER_SITE_OTHER): Payer: Medicare Other | Admitting: Family Medicine

## 2018-12-07 VITALS — BP 145/80 | HR 83 | Temp 98.4°F | Ht 63.0 in | Wt 161.2 lb

## 2018-12-07 DIAGNOSIS — I10 Essential (primary) hypertension: Secondary | ICD-10-CM | POA: Diagnosis not present

## 2018-12-07 DIAGNOSIS — J209 Acute bronchitis, unspecified: Secondary | ICD-10-CM | POA: Diagnosis not present

## 2018-12-07 MED ORDER — BENZONATATE 200 MG PO CAPS: 200.0000 mg | ORAL_CAPSULE | Freq: Three times a day (TID) | ORAL | 1 refills | Status: DC | PRN

## 2018-12-07 MED ORDER — AMLODIPINE BESYLATE 5 MG PO TABS: 5.0000 mg | ORAL_TABLET | Freq: Every day | ORAL | 11 refills | Status: DC

## 2018-12-07 NOTE — Assessment & Plan Note (Signed)
Overall much improved except for cough  Added tessalon tid prn  She will continue DM otc as needed and lozenges if helpful  Update if not starting to improve in a week or if worsening   Reassuring exam today

## 2018-12-07 NOTE — Patient Instructions (Signed)
Stick with your current blood pressure medicine  I think you do have hypertension (essential hypertension is the name of chronic high blood pressure)   Try to stick to the DASH eating plan  Try to get most of your carbohydrates from produce (with the exception of white potatoes)  Eat less bread/pasta/rice/snack foods/cereals/sweets and other items from the middle of the grocery store (processed carbs) Keep walking for exercise   Your blood pressure is not quite at goal- we can increase or add medicine if we need to later   For cough you can add the tessalon  Continue the DM cough medicine over the counter

## 2018-12-07 NOTE — Progress Notes (Signed)
Subjective:    Patient ID: Connie Bell, female    DOB: 08/17/1941, 78 y.o.   MRN: [id number]  HPI Here for f/u of elevated BP  Wt Readings from Last 3 Encounters:  12/07/18 161 lb 4 oz (73.1 kg)  11/23/18 159 lb (72.1 kg)  07/16/18 158 lb 8 oz (71.9 kg)   28.56 kg/m   Last visit pt walked in with elevated bp  This was after starting steroids for bronchitis  She had a headache   BP Readings from Last 3 Encounters:  12/07/18 (!) 148/84  11/23/18 (!) 180/80  07/16/18 138/84  bp at home is from 130s-140s /80s   We started her on amlodipine Disc poss of early HTN  She agreed to take it for the short term Also disc DASH eating and lifestyle change   No problems with medication   Still coughing from her bronchitis  Uses cough drops  She got 12 hour DM otc and it helps some also  Drinking lots of fluids       Review of Systems  Constitutional: Negative for activity change, appetite change, fatigue, fever and unexpected weight change.  HENT: Negative for congestion, ear pain, rhinorrhea, sinus pressure and sore throat.   Eyes: Negative for pain, redness and visual disturbance.  Respiratory: Positive for cough. Negative for choking, shortness of breath and wheezing.   Cardiovascular: Negative for chest pain and palpitations.  Gastrointestinal: Negative for abdominal pain, blood in stool, constipation and diarrhea.  Endocrine: Negative for polydipsia and polyuria.  Genitourinary: Negative for dysuria, frequency and urgency.  Musculoskeletal: Negative for arthralgias, back pain and myalgias.  Skin: Negative for pallor and rash.  Allergic/Immunologic: Negative for environmental allergies.  Neurological: Negative for dizziness, syncope and headaches.  Hematological: Negative for adenopathy. Does not bruise/bleed easily.  Psychiatric/Behavioral: Negative for decreased concentration and dysphoric mood. The patient is not nervous/anxious.        Objective:   Physical  Exam Constitutional:      General: She is not in acute distress.    Appearance: Normal appearance. She is well-developed and normal weight. She is not ill-appearing.  HENT:     Head: Normocephalic and atraumatic.     Nose: Nose normal.     Mouth/Throat:     Mouth: Mucous membranes are moist.     Pharynx: Oropharynx is clear. No oropharyngeal exudate or posterior oropharyngeal erythema.  Eyes:     General: No scleral icterus.       Right eye: No discharge.        Left eye: No discharge.     Conjunctiva/sclera: Conjunctivae normal.     Pupils: Pupils are equal, round, and reactive to light.  Neck:     Musculoskeletal: Normal range of motion and neck supple. No neck rigidity or muscular tenderness.     Thyroid: No thyromegaly.     Vascular: No carotid bruit or JVD.  Cardiovascular:     Rate and Rhythm: Normal rate and regular rhythm.     Pulses: Normal pulses.     Heart sounds: Normal heart sounds. No gallop.   Pulmonary:     Effort: Pulmonary effort is normal. No respiratory distress.     Breath sounds: Normal breath sounds. No wheezing or rales.  Abdominal:     General: Abdomen is flat. There is no distension or abdominal bruit.  Musculoskeletal:     Right lower leg: No edema.     Left lower leg: No edema.  Lymphadenopathy:  Cervical: No cervical adenopathy.  Skin:    General: Skin is warm and dry.     Findings: No rash.  Neurological:     General: No focal deficit present.     Mental Status: She is alert.     Deep Tendon Reflexes: Reflexes are normal and symmetric.  Psychiatric:        Mood and Affect: Mood normal.           Assessment & Plan:   Problem List Items Addressed This Visit      Cardiovascular and Mediastinum   Essential hypertension - Primary    Disc nature of HTN and why we treat it  Rev handouts from last visit  Enc to work on good lifestyle habits BP: (!) 145/80 also rev last labs from sept  bp is not at goal but pt declines inc in  amlodipine dose or addn of more medication  She will continue to work on lifestyle change and monitor at home       Relevant Medications   amLODipine (NORVASC) 5 MG tablet     Respiratory   Acute bronchitis    Overall much improved except for cough  Added tessalon tid prn  She will continue DM otc as needed and lozenges if helpful  Update if not starting to improve in a week or if worsening   Reassuring exam today

## 2018-12-07 NOTE — Assessment & Plan Note (Signed)
Disc nature of HTN and why we treat it  Rev handouts from last visit  Enc to work on good lifestyle habits BP: (!) 145/80 also rev last labs from sept  bp is not at goal but pt declines inc in amlodipine dose or addn of more medication  She will continue to work on lifestyle change and monitor at home

## 2019-03-19 LAB — HM DIABETES EYE EXAM

## 2019-05-16 DIAGNOSIS — H524 Presbyopia: Secondary | ICD-10-CM | POA: Diagnosis not present

## 2019-05-16 DIAGNOSIS — H2513 Age-related nuclear cataract, bilateral: Secondary | ICD-10-CM | POA: Diagnosis not present

## 2019-07-01 ENCOUNTER — Other Ambulatory Visit: Payer: Self-pay | Admitting: Family Medicine

## 2019-07-01 DIAGNOSIS — Z1231 Encounter for screening mammogram for malignant neoplasm of breast: Secondary | ICD-10-CM

## 2019-07-19 ENCOUNTER — Ambulatory Visit (INDEPENDENT_AMBULATORY_CARE_PROVIDER_SITE_OTHER): Payer: Medicare Other | Admitting: Family Medicine

## 2019-07-19 ENCOUNTER — Ambulatory Visit (INDEPENDENT_AMBULATORY_CARE_PROVIDER_SITE_OTHER): Payer: Medicare Other

## 2019-07-19 ENCOUNTER — Encounter: Payer: Self-pay | Admitting: Family Medicine

## 2019-07-19 ENCOUNTER — Other Ambulatory Visit: Payer: Self-pay

## 2019-07-19 ENCOUNTER — Ambulatory Visit: Payer: Medicare Other

## 2019-07-19 VITALS — Ht 65.5 in | Wt 160.0 lb

## 2019-07-19 VITALS — BP 140/80 | HR 74 | Temp 97.2°F | Ht 63.0 in | Wt 160.6 lb

## 2019-07-19 DIAGNOSIS — E7800 Pure hypercholesterolemia, unspecified: Secondary | ICD-10-CM

## 2019-07-19 DIAGNOSIS — Z0000 Encounter for general adult medical examination without abnormal findings: Secondary | ICD-10-CM | POA: Diagnosis not present

## 2019-07-19 DIAGNOSIS — I10 Essential (primary) hypertension: Secondary | ICD-10-CM

## 2019-07-19 DIAGNOSIS — R7309 Other abnormal glucose: Secondary | ICD-10-CM | POA: Diagnosis not present

## 2019-07-19 DIAGNOSIS — Z23 Encounter for immunization: Secondary | ICD-10-CM

## 2019-07-19 NOTE — Assessment & Plan Note (Signed)
Disc goals for lipids and reasons to control them Rev last labs with pt Rev low sat fat diet in detail Hope for improvement with labs today  Has strong fam hx of high cholesterol  May need statin (she wants to avoid)

## 2019-07-19 NOTE — Patient Instructions (Signed)
Keep checking blood pressure at home  If the top number runs over 140 please let me know   Flu shot today  Labs today   Keep walking  Let me know if you want to get a pneumonia shot  Get your mammogram as planned   Take care of yourself

## 2019-07-19 NOTE — Assessment & Plan Note (Signed)
Reviewed health habits including diet and exercise and skin cancer prevention Reviewed appropriate screening tests for age  Also reviewed health mt list, fam hx and immunization status , as well as social and family history   Labs ordered amw rev See HPI Mammogram planned next month Flu shot today  Pt declines pna vaccines  Declines dexa

## 2019-07-19 NOTE — Progress Notes (Signed)
Subjective:    Patient ID: Connie Bell, female    DOB: 1941-09-06, 78 y.o.   MRN: [id number]  HPI Here for health maintenance exam and to review chronic medical problems    Had amw this am  No concerns or gaps noted   Feeling good   Mammogram is scheduled for 08/17/19  Self breast exam -no lumps or changes   Flu shot-high dose today  Declines PNA vaccines in the past    Colonoscopy 2/19 with 3 y recall  Pos cologuard before that  famiy h/o colon cancer (brother)  dexa 2/05  Not interested in another one  No falls or fx Takes D and ca  Also fish oil     Wt Readings from Last 3 Encounters:  07/19/19 160 lb 9 oz (72.8 kg)  07/19/19 160 lb (72.6 kg)  12/07/18 161 lb 4 oz (73.1 kg)  started walking 2 miles every day and wants to increase it -walks with family  Eating healthy  28.44 kg/m   bp is stable today  No cp or palpitations or headaches or edema  No side effects to medicines  BP Readings from Last 3 Encounters:  07/19/19 (!) 146/78  12/07/18 (!) 145/80  11/23/18 (!) 180/80    Declined more medication in the past  At home is in the [id number] systolic  Re check BP: [id number]    Due for labs  H/o elevated glucose level Will check A1C today   Hyperlipidemia Lab Results  Component Value Date   CHOL 307 (H) 07/16/2018   HDL 46.10 07/16/2018   LDLDIRECT 164.0 07/16/2018   TRIG (H) 07/16/2018    469.0 Triglyceride is over 400; calculations on Lipids are invalid.   CHOLHDL 7 07/16/2018   Diet has changed some Eats protein -chicken and fish   (no red meat)  Eats vegetables and fruit  Almond milk with cereal  No fried food  occ egg cooked with olive oil   Tried atorvastatin in the past -unsure what side effect   Whole family has high cholesterol   Patient Active Problem List   Diagnosis Date Noted  . Essential hypertension 11/23/2018  . Elevated glucose level 07/18/2018  . Routine general medical examination at a health care facility 07/07/2017   . Heme positive stool 08/08/2016  . Colon cancer screening 07/14/2016  . Family history of colon cancer 07/14/2016  . Pre-employment examination 08/26/2013  . Hemorrhoids 10/28/2011  . HEMORRHOIDS, INTERNAL 01/08/2011  . HYPERCHOLESTEROLEMIA 01/12/2008  . DEPRESSION 01/12/2008  . ROSACEA 01/12/2008   Past Medical History:  Diagnosis Date  . Depression   . Hyperlipemia    borderline not on Rx.  . Osteoporosis   . Rosacea   . Skin cancer    on nose and neck 2 times/basal cell   Past Surgical History:  Procedure Laterality Date  . COLONOSCOPY    . HEMORRHOID SURGERY     Pt passed out past surgery due to pain!  Marland Kitchen PARTIAL HYSTERECTOMY     secondary to abn pap  . Patient refuses     mammograms, chol treatment and vaccines  . POLYPECTOMY     Social History   Tobacco Use  . Smoking status: Never Smoker  . Smokeless tobacco: Never Used  Substance Use Topics  . Alcohol use: No  . Drug use: No   Family History  Problem Relation Age of Onset  . Heart disease Mother        MI  . Cancer  Sister 53       lung cancer  . Lung cancer Sister   . Colon cancer Brother 71       died at 74  . Heart disease Father        CHF  . Stomach cancer Maternal Aunt   . Colon polyps Neg Hx   . Esophageal cancer Neg Hx   . Rectal cancer Neg Hx   . Breast cancer Neg Hx    Allergies  Allergen Reactions  . Alendronate Sodium     REACTION: depressed, headaches, stomach aches  . Morphine     REACTION: /whelts  . Atorvastatin     REACTION: pt does not remember   Current Outpatient Medications on File Prior to Visit  Medication Sig Dispense Refill  . amLODipine (NORVASC) 5 MG tablet Take 1 tablet (5 mg total) by mouth daily. 30 tablet 11  . Cholecalciferol (VITAMIN D PO) Take 400 Units by mouth daily.    . Multiple Vitamins-Minerals (VITAMIN D3 COMPLETE PO) Take 100 mcg by mouth daily.    . NON FORMULARY Doterra on Guard-Take one daily    . Omega-3 Fatty Acids (FISH OIL) 1000 MG CAPS  Take 1pill by mouth daily     No current facility-administered medications on file prior to visit.      Review of Systems  Constitutional: Negative for activity change, appetite change, fatigue, fever and unexpected weight change.  HENT: Negative for congestion, ear pain, rhinorrhea, sinus pressure and sore throat.   Eyes: Negative for pain, redness and visual disturbance.  Respiratory: Negative for cough, shortness of breath and wheezing.   Cardiovascular: Negative for chest pain and palpitations.  Gastrointestinal: Negative for abdominal pain, blood in stool, constipation and diarrhea.  Endocrine: Negative for polydipsia and polyuria.  Genitourinary: Negative for dysuria, frequency and urgency.  Musculoskeletal: Negative for arthralgias, back pain and myalgias.  Skin: Negative for pallor and rash.  Allergic/Immunologic: Negative for environmental allergies.  Neurological: Negative for dizziness, syncope and headaches.  Hematological: Negative for adenopathy. Does not bruise/bleed easily.  Psychiatric/Behavioral: Negative for decreased concentration and dysphoric mood. The patient is not nervous/anxious.        Objective:   Physical Exam Constitutional:      General: She is not in acute distress.    Appearance: Normal appearance. She is well-developed. She is not ill-appearing or diaphoretic.  HENT:     Head: Normocephalic and atraumatic.     Right Ear: Tympanic membrane, ear canal and external ear normal.     Left Ear: Tympanic membrane, ear canal and external ear normal.     Nose: Nose normal. No congestion.     Mouth/Throat:     Mouth: Mucous membranes are moist.     Pharynx: Oropharynx is clear. No posterior oropharyngeal erythema.  Eyes:     General: No scleral icterus.    Extraocular Movements: Extraocular movements intact.     Conjunctiva/sclera: Conjunctivae normal.     Pupils: Pupils are equal, round, and reactive to light.  Neck:     Musculoskeletal: Normal range  of motion and neck supple. No neck rigidity or muscular tenderness.     Thyroid: No thyromegaly.     Vascular: No carotid bruit or JVD.  Cardiovascular:     Rate and Rhythm: Normal rate and regular rhythm.     Pulses: Normal pulses.     Heart sounds: Normal heart sounds. No gallop.   Pulmonary:     Effort: Pulmonary effort is normal.  No respiratory distress.     Breath sounds: Normal breath sounds. No wheezing.     Comments: Good air exch Chest:     Chest wall: No tenderness.  Abdominal:     General: Bowel sounds are normal. There is no distension or abdominal bruit.     Palpations: Abdomen is soft. There is no mass.     Tenderness: There is no abdominal tenderness.     Hernia: No hernia is present.  Genitourinary:    Comments: Breast exam: No mass, nodules, thickening, tenderness, bulging, retraction, inflamation, nipple discharge or skin changes noted.  No axillary or clavicular LA.     Musculoskeletal: Normal range of motion.        General: No tenderness.     Right lower leg: No edema.     Left lower leg: No edema.  Lymphadenopathy:     Cervical: No cervical adenopathy.  Skin:    General: Skin is warm and dry.     Coloration: Skin is not pale.     Findings: No erythema or rash.     Comments: Solar lentigines diffusely   Neurological:     Mental Status: She is alert. Mental status is at baseline.     Cranial Nerves: No cranial nerve deficit.     Motor: No abnormal muscle tone.     Coordination: Coordination normal.     Gait: Gait normal.     Deep Tendon Reflexes: Reflexes are normal and symmetric. Reflexes normal.  Psychiatric:        Mood and Affect: Mood normal.        Cognition and Memory: Cognition and memory normal.           Assessment & Plan:   Problem List Items Addressed This Visit      Cardiovascular and Mediastinum   Essential hypertension    bp in fair control at this time  Per pt always 130s/70s at home with an accurate cuff BP Readings from  Last 1 Encounters:  07/19/19 140/80   No changes needed Most recent labs reviewed  Disc lifstyle change with low sodium diet and exercise        Relevant Orders   Comprehensive metabolic panel   CBC with Differential/Platelet   Lipid panel   TSH     Other   HYPERCHOLESTEROLEMIA    Disc goals for lipids and reasons to control them Rev last labs with pt Rev low sat fat diet in detail Hope for improvement with labs today  Has strong fam hx of high cholesterol  May need statin (she wants to avoid)       Routine general medical examination at a health care facility - Primary    Reviewed health habits including diet and exercise and skin cancer prevention Reviewed appropriate screening tests for age  Also reviewed health mt list, fam hx and immunization status , as well as social and family history   Labs ordered amw rev See HPI Mammogram planned next month Flu shot today  Pt declines pna vaccines  Declines dexa         Relevant Orders   Flu Vaccine QUAD High Dose(Fluad) (Completed)   Elevated glucose level    A1C with labs today disc imp of low glycemic diet and wt loss to prevent DM2       Relevant Orders   Hemoglobin A1c    Other Visit Diagnoses    Need for influenza vaccination       Relevant  Orders   Flu Vaccine QUAD High Dose(Fluad) (Completed)

## 2019-07-19 NOTE — Progress Notes (Signed)
PCP notes:  Health Maintenance:  Flu and mammogram are due. Mammogram is scheduled for the 30th.  Abnormal Screenings:  None  Patient concerns:  None  Nurse concerns:  None  Next PCP appt.: 07/19/2019 at 2:30

## 2019-07-19 NOTE — Assessment & Plan Note (Signed)
A1C with labs today  disc imp of low glycemic diet and wt loss to prevent DM2  

## 2019-07-19 NOTE — Progress Notes (Signed)
Subjective:   Connie Bell is a 78 y.o. female who presents for Medicare Annual (Subsequent) preventive examination.  This visit type was conducted due to national recommendations for restrictions regarding the COVID-19 Pandemic (e.g. social distancing). This format is felt to be most appropriate for this patient at this time. All issues noted in this document were discussed and addressed. No physical exam was performed (except for noted visual exam findings with Video Visits). This patient, [name], has given permission to perform this visit via telephone. Vital signs may be absent or patient reported.  Patient location:  At home  Nurse location:  At home    Review of Systems:  n/a Cardiac Risk Factors include: advanced age (>31men, >59 women);hypertension     Objective:     Vitals: Ht 5' 5.5" (1.664 m) Comment: per patient  Wt 160 lb (72.6 kg) Comment: per patient  BMI 26.22 kg/m   Body mass index is 26.22 kg/m.  Advanced Directives 07/19/2019 07/16/2018 07/07/2017 09/15/2016 08/25/2014 08/19/2014  Does Patient Have a Medical Advance Directive? - - No No Yes No;Yes  Type of Advance Directive Healthcare Power of [place] will Living will  Does patient want to make changes to medical advance directive? - - - - No - Patient declined -  Copy of [place] in Chart? No - copy requested No - copy requested - - No - copy requested -  Would patient like information on creating a medical advance directive? - - - - No - patient declined information No - patient declined information    Tobacco Social History   Tobacco Use  Smoking Status Never Smoker  Smokeless Tobacco Never Used     Counseling given: Not Answered   Clinical Intake:  Pre-visit preparation completed: Yes  Pain : No/denies pain     Nutritional Status: BMI 25 -29 Overweight Nutritional Risks: None Diabetes: No  How often do you need to have  someone help you when you read instructions, pamphlets, or other written materials from your doctor or pharmacy?: 1 - Never What is the last grade level you completed in school?: some college  Interpreter Needed?: No  Information entered by :: NAllen LPN  Past Medical History:  Diagnosis Date  . Depression   . Hyperlipemia    borderline not on Rx.  . Osteoporosis   . Rosacea   . Skin cancer    on nose and neck 2 times/basal cell   Past Surgical History:  Procedure Laterality Date  . COLONOSCOPY    . HEMORRHOID SURGERY     Pt passed out past surgery due to pain!  Marland Kitchen PARTIAL HYSTERECTOMY     secondary to abn pap  . Patient refuses     mammograms, chol treatment and vaccines  . POLYPECTOMY     Family History  Problem Relation Age of Onset  . Heart disease Mother        MI  . Cancer Sister 29       lung cancer  . Lung cancer Sister   . Colon cancer Brother 68       died at 61  . Heart disease Father        CHF  . Stomach cancer Maternal Aunt   . Colon polyps Neg Hx   . Esophageal cancer Neg Hx   . Rectal cancer Neg Hx   . Breast cancer Neg Hx    Social History  Socioeconomic History  . Marital status: Widowed    Spouse name: Not on file  . Number of children: 2  . Years of education: Not on file  . Highest education level: Not on file  Occupational History  . Occupation: employed with school    Employer: RETIRED  Social Needs  . Financial resource strain: Not hard at all  . Food insecurity    Worry: Never true    Inability: Never true  . Transportation needs    Medical: No    Non-medical: No  Tobacco Use  . Smoking status: Never Smoker  . Smokeless tobacco: Never Used  Substance and Sexual Activity  . Alcohol use: No  . Drug use: No  . Sexual activity: Not Currently  Lifestyle  . Physical activity    Days per week: 7 days    Minutes per session: 40 min  . Stress: Not at all  Relationships  . Social Herbalist on phone: Not on file     Gets together: Not on file    Attends religious service: Not on file    Active member of club or organization: Not on file    Attends meetings of clubs or organizations: Not on file    Relationship status: Not on file  Other Topics Concern  . Not on file  Social History Narrative   Widowed- husband was suicide   Children; twins    Outpatient Encounter Medications as of 07/19/2019  Medication Sig  . amLODipine (NORVASC) 5 MG tablet Take 1 tablet (5 mg total) by mouth daily.  . Multiple Vitamins-Minerals (VITAMIN D3 COMPLETE PO) Take 100 mcg by mouth daily.  . NON FORMULARY Doterra on Guard-Take one daily  . Omega-3 Fatty Acids (FISH OIL) 1000 MG CAPS Take 1pill by mouth daily  . benzonatate (TESSALON) 200 MG capsule Take 1 capsule (200 mg total) by mouth 3 (three) times daily as needed for cough. Do not bite pill (Patient not taking: Reported on 07/19/2019)  . Cholecalciferol (VITAMIN D PO) Take 400 Units by mouth daily.   Facility-Administered Encounter Medications as of 07/19/2019  Medication  . 0.9 %  sodium chloride infusion  . 0.9 %  sodium chloride infusion    Activities of Daily Living In your present state of health, do you have any difficulty performing the following activities: 07/19/2019  Hearing? Y  Comment trouble with right ear  Vision? N  Difficulty concentrating or making decisions? N  Walking or climbing stairs? N  Dressing or bathing? N  Doing errands, shopping? N  Preparing Food and eating ? N  Using the Toilet? N  In the past six months, have you accidently leaked urine? Y  Do you have problems with loss of bowel control? N  Managing your Medications? N  Managing your Finances? N  Housekeeping or managing your Housekeeping? N  Some recent data might be hidden    Patient Care Team: Tower, Wynelle Fanny, MD as PCP - General    Assessment:   This is a routine wellness examination for Connie Bell.  Exercise Activities and Dietary recommendations Current Exercise  Habits: Home exercise routine, Type of exercise: walking, Time (Minutes): 40, Frequency (Times/Week): 7, Weekly Exercise (Minutes/Week): 280  Goals    . Increase physical activity     Starting 07/16/2018, I will continue to walk 60 minutes 3 days per week.     . Patient Stated     07/19/2019, no goals  Fall Risk Fall Risk  07/19/2019 07/16/2018 07/07/2017  Falls in the past year? 0 No No  Risk for fall due to : Medication side effect - -  Follow up Falls evaluation completed;Falls prevention discussed - -   Is the patient's home free of loose throw rugs in walkways, pet beds, electrical cords, etc?   yes      Grab bars in the bathroom? yes      Handrails on the stairs?   yes      Adequate lighting?   yes  Timed Get Up and Go performed: n/a  Depression Screen PHQ 2/9 Scores 07/19/2019 07/16/2018 07/07/2017  PHQ - 2 Score 0 0 1  PHQ- 9 Score 1 0 1     Cognitive Function MMSE - Mini Mental State Exam 07/19/2019 07/16/2018 07/07/2017  Orientation to time 5 5 5   Orientation to Place 5 5 5   Registration 3 3 3   Attention/ Calculation 5 0 0  Recall 3 3 2   Recall-comments - - pt was unable to recall 1 of 3 words  Language- name 2 objects 0 0 0  Language- repeat 1 1 1   Language- follow 3 step command 0 3 3  Language- read & follow direction 0 0 0  Write a sentence 0 0 0  Copy design 0 0 0  Total score 22 20 19    Mini Cog  Mini-Cog screen was completed. Maximum score is 22. A value of 0 denotes this part of the MMSE was not completed or the patient failed this part of the Mini-Cog screening.       Immunization History  Administered Date(s) Administered  . Influenza, High Dose Seasonal PF 09/10/2017  . Influenza,inj,Quad PF,6+ Mos 08/26/2013, 07/14/2016, 08/19/2018  . Influenza-Unspecified 08/17/2014  . PPD Test 07/20/2013  . Td 11/17/1989    Qualifies for Shingles Vaccine? yes  Screening Tests Health Maintenance  Topic Date Due  . MAMMOGRAM  03/30/2019  . INFLUENZA  VACCINE  06/18/2019  . TETANUS/TDAP  12/19/2020 (Originally 11/18/1999)  . PNA vac Low Risk Adult (1 of 2 - PCV13) 12/20/2023 (Originally 09/26/2006)  . COLONOSCOPY  01/05/2021  . DEXA SCAN  Completed    Cancer Screenings: Lung: Low Dose CT Chest recommended if Age 62-80 years, 30 pack-year currently smoking OR have quit w/in 15years. Patient does not qualify. Breast:  Up to date on Mammogram? Yes   Up to date of Bone Density/Dexa? Yes Colorectal: up to dtae  Additional Screenings: : Hepatitis C Screening: n/a     Plan:    Patient has no goals set.   I have personally reviewed and noted the following in the patient's chart:   . Medical and social history . Use of alcohol, tobacco or illicit drugs  . Current medications and supplements . Functional ability and status . Nutritional status . Physical activity . Advanced directives . List of other physicians . Hospitalizations, surgeries, and ER visits in previous 12 months . Vitals . Screenings to include cognitive, depression, and falls . Referrals and appointments  In addition, I have reviewed and discussed with patient certain preventive protocols, quality metrics, and best practice recommendations. A written personalized care plan for preventive services as well as general preventive health recommendations were provided to patient.     Kellie Simmering, LPN  [phone]

## 2019-07-19 NOTE — Assessment & Plan Note (Addendum)
bp in fair control at this time  Per pt always 130s/70s at home with an accurate cuff BP Readings from Last 1 Encounters:  07/19/19 140/80   No changes needed Most recent labs reviewed  Disc lifstyle change with low sodium diet and exercise

## 2019-07-19 NOTE — Patient Instructions (Signed)
Connie Bell , Thank you for taking time to come for your Medicare Wellness Visit. I appreciate your ongoing commitment to your health goals. Please review the following plan we discussed and let me know if I can assist you in the future.   Screening recommendations/referrals: Colonoscopy: 12/2017 Mammogram: scheduled Bone Density: 12/2003 Recommended yearly ophthalmology/optometry visit for glaucoma screening and checkup Recommended yearly dental visit for hygiene and checkup  Vaccinations: Influenza vaccine: due Pneumococcal vaccine: postponed Tdap vaccine: postponed Shingles vaccine: discussed    Advanced directives: Please bring a copy of your POA (Power of Attorney) and/or Living Will to your next appointment.    Conditions/risks identified: overweight  Next appointment: 07/19/2019 at 2:30   Preventive Care 78 Years and Older, Female Preventive care refers to lifestyle choices and visits with your health care provider that can promote health and wellness. What does preventive care include?  A yearly physical exam. This is also called an annual well check.  Dental exams once or twice a year.  Routine eye exams. Ask your health care provider how often you should have your eyes checked.  Personal lifestyle choices, including:  Daily care of your teeth and gums.  Regular physical activity.  Eating a healthy diet.  Avoiding tobacco and drug use.  Limiting alcohol use.  Practicing safe sex.  Taking low-dose aspirin every day.  Taking vitamin and mineral supplements as recommended by your health care provider. What happens during an annual well check? The services and screenings done by your health care provider during your annual well check will depend on your age, overall health, lifestyle risk factors, and family history of disease. Counseling  Your health care provider may ask you questions about your:  Alcohol use.  Tobacco use.  Drug use.  Emotional  well-being.  Home and relationship well-being.  Sexual activity.  Eating habits.  History of falls.  Memory and ability to understand (cognition).  Work and work Statistician.  Reproductive health. Screening  You may have the following tests or measurements:  Height, weight, and BMI.  Blood pressure.  Lipid and cholesterol levels. These may be checked every 5 years, or more frequently if you are over 3 years old.  Skin check.  Lung cancer screening. You may have this screening every year starting at age 43 if you have a 30-pack-year history of smoking and currently smoke or have quit within the past 15 years.  Fecal occult blood test (FOBT) of the stool. You may have this test every year starting at age 80.  Flexible sigmoidoscopy or colonoscopy. You may have a sigmoidoscopy every 5 years or a colonoscopy every 10 years starting at age 52.  Hepatitis C blood test.  Hepatitis B blood test.  Sexually transmitted disease (STD) testing.  Diabetes screening. This is done by checking your blood sugar (glucose) after you have not eaten for a while (fasting). You may have this done every 1-3 years.  Bone density scan. This is done to screen for osteoporosis. You may have this done starting at age 52.  Mammogram. This may be done every 1-2 years. Talk to your health care provider about how often you should have regular mammograms. Talk with your health care provider about your test results, treatment options, and if necessary, the need for more tests. Vaccines  Your health care provider may recommend certain vaccines, such as:  Influenza vaccine. This is recommended every year.  Tetanus, diphtheria, and acellular pertussis (Tdap, Td) vaccine. You may need a Td booster  every 10 years.  Zoster vaccine. You may need this after age 80.  Pneumococcal 13-valent conjugate (PCV13) vaccine. One dose is recommended after age 88.  Pneumococcal polysaccharide (PPSV23) vaccine. One  dose is recommended after age 62. Talk to your health care provider about which screenings and vaccines you need and how often you need them. This information is not intended to replace advice given to you by your health care provider. Make sure you discuss any questions you have with your health care provider. Document Released: 11/30/2015 Document Revised: 07/23/2016 Document Reviewed: 09/04/2015 Elsevier Interactive Patient Education  2017 [place] Prevention in the Home Falls can cause injuries. They can happen to people of all ages. There are many things you can do to make your home safe and to help prevent falls. What can I do on the outside of my home?  Regularly fix the edges of walkways and driveways and fix any cracks.  Remove anything that might make you trip as you walk through a door, such as a raised step or threshold.  Trim any bushes or trees on the path to your home.  Use bright outdoor lighting.  Clear any walking paths of anything that might make someone trip, such as rocks or tools.  Regularly check to see if handrails are loose or broken. Make sure that both sides of any steps have handrails.  Any raised decks and porches should have guardrails on the edges.  Have any leaves, snow, or ice cleared regularly.  Use sand or salt on walking paths during winter.  Clean up any spills in your garage right away. This includes oil or grease spills. What can I do in the bathroom?  Use night lights.  Install grab bars by the toilet and in the tub and shower. Do not use towel bars as grab bars.  Use non-skid mats or decals in the tub or shower.  If you need to sit down in the shower, use a plastic, non-slip stool.  Keep the floor dry. Clean up any water that spills on the floor as soon as it happens.  Remove soap buildup in the tub or shower regularly.  Attach bath mats securely with double-sided non-slip rug tape.  Do not have throw rugs and other  things on the floor that can make you trip. What can I do in the bedroom?  Use night lights.  Make sure that you have a light by your bed that is easy to reach.  Do not use any sheets or blankets that are too big for your bed. They should not hang down onto the floor.  Have a firm chair that has side arms. You can use this for support while you get dressed.  Do not have throw rugs and other things on the floor that can make you trip. What can I do in the kitchen?  Clean up any spills right away.  Avoid walking on wet floors.  Keep items that you use a lot in easy-to-reach places.  If you need to reach something above you, use a strong step stool that has a grab bar.  Keep electrical cords out of the way.  Do not use floor polish or wax that makes floors slippery. If you must use wax, use non-skid floor wax.  Do not have throw rugs and other things on the floor that can make you trip. What can I do with my stairs?  Do not leave any items on the stairs.  Make  sure that there are handrails on both sides of the stairs and use them. Fix handrails that are broken or loose. Make sure that handrails are as long as the stairways.  Check any carpeting to make sure that it is firmly attached to the stairs. Fix any carpet that is loose or worn.  Avoid having throw rugs at the top or bottom of the stairs. If you do have throw rugs, attach them to the floor with carpet tape.  Make sure that you have a light switch at the top of the stairs and the bottom of the stairs. If you do not have them, ask someone to add them for you. What else can I do to help prevent falls?  Wear shoes that:  Do not have high heels.  Have rubber bottoms.  Are comfortable and fit you well.  Are closed at the toe. Do not wear sandals.  If you use a stepladder:  Make sure that it is fully opened. Do not climb a closed stepladder.  Make sure that both sides of the stepladder are locked into place.  Ask  someone to hold it for you, if possible.  Clearly mark and make sure that you can see:  Any grab bars or handrails.  First and last steps.  Where the edge of each step is.  Use tools that help you move around (mobility aids) if they are needed. These include:  Canes.  Walkers.  Scooters.  Crutches.  Turn on the lights when you go into a dark area. Replace any light bulbs as soon as they burn out.  Set up your furniture so you have a clear path. Avoid moving your furniture around.  If any of your floors are uneven, fix them.  If there are any pets around you, be aware of where they are.  Review your medicines with your doctor. Some medicines can make you feel dizzy. This can increase your chance of falling. Ask your doctor what other things that you can do to help prevent falls. This information is not intended to replace advice given to you by your health care provider. Make sure you discuss any questions you have with your health care provider. Document Released: 08/30/2009 Document Revised: 04/10/2016 Document Reviewed: 12/08/2014 Elsevier Interactive Patient Education  2017 [employer].

## 2019-07-20 LAB — CBC WITH DIFFERENTIAL/PLATELET
Basophils Absolute: 0.1 10*3/uL (ref 0.0–0.1)
Basophils Relative: 1.1 % (ref 0.0–3.0)
Eosinophils Absolute: 0.2 10*3/uL (ref 0.0–0.7)
Eosinophils Relative: 2.9 % (ref 0.0–5.0)
HCT: 39.8 % (ref 36.0–46.0)
Hemoglobin: 13.5 g/dL (ref 12.0–15.0)
Lymphocytes Relative: 31.8 % (ref 12.0–46.0)
Lymphs Abs: 2 10*3/uL (ref 0.7–4.0)
MCHC: 33.9 g/dL (ref 30.0–36.0)
MCV: 92.8 fl (ref 78.0–100.0)
Monocytes Absolute: 0.5 10*3/uL (ref 0.1–1.0)
Monocytes Relative: 8.4 % (ref 3.0–12.0)
Neutro Abs: 3.6 10*3/uL (ref 1.4–7.7)
Neutrophils Relative %: 55.8 % (ref 43.0–77.0)
Platelets: 359 10*3/uL (ref 150.0–400.0)
RBC: 4.29 Mil/uL (ref 3.87–5.11)
RDW: 14.5 % (ref 11.5–15.5)
WBC: 6.4 10*3/uL (ref 4.0–10.5)

## 2019-07-20 LAB — COMPREHENSIVE METABOLIC PANEL
ALT: 21 U/L (ref 0–35)
AST: 17 U/L (ref 0–37)
Albumin: 4.4 g/dL (ref 3.5–5.2)
Alkaline Phosphatase: 91 U/L (ref 39–117)
BUN: 13 mg/dL (ref 6–23)
CO2: 29 mEq/L (ref 19–32)
Calcium: 9.6 mg/dL (ref 8.4–10.5)
Chloride: 101 mEq/L (ref 96–112)
Creatinine, Ser: 0.81 mg/dL (ref 0.40–1.20)
GFR: 68.42 mL/min (ref >60.00)
Glucose, Bld: 100 mg/dL — ABNORMAL HIGH (ref 70–99)
Potassium: 4 mEq/L (ref 3.5–5.1)
Sodium: 138 mEq/L (ref 135–145)
Total Bilirubin: 0.5 mg/dL (ref 0.2–1.2)
Total Protein: 7.8 g/dL (ref 6.0–8.3)

## 2019-07-20 LAB — TSH: TSH: 1.9 u[IU]/mL (ref 0.35–4.50)

## 2019-07-20 LAB — LIPID PANEL
Cholesterol: 306 mg/dL — ABNORMAL HIGH (ref 0–200)
HDL: 45.7 mg/dL (ref >39.00)
Total CHOL/HDL Ratio: 7
Triglycerides: 491 mg/dL — ABNORMAL HIGH (ref 0.0–149.0)

## 2019-07-20 LAB — LDL CHOLESTEROL, DIRECT: Direct LDL: 151 mg/dL

## 2019-07-20 LAB — HEMOGLOBIN A1C: Hgb A1c MFr Bld: 7.3 % — ABNORMAL HIGH (ref 4.6–6.5)

## 2019-07-26 ENCOUNTER — Encounter: Payer: Self-pay | Admitting: Family Medicine

## 2019-07-26 ENCOUNTER — Other Ambulatory Visit: Payer: Self-pay

## 2019-07-26 ENCOUNTER — Ambulatory Visit (INDEPENDENT_AMBULATORY_CARE_PROVIDER_SITE_OTHER): Payer: Medicare Other | Admitting: Family Medicine

## 2019-07-26 VITALS — BP 132/70 | HR 79 | Temp 97.4°F | Ht 63.0 in | Wt 159.4 lb

## 2019-07-26 DIAGNOSIS — I10 Essential (primary) hypertension: Secondary | ICD-10-CM | POA: Diagnosis not present

## 2019-07-26 DIAGNOSIS — E119 Type 2 diabetes mellitus without complications: Secondary | ICD-10-CM

## 2019-07-26 DIAGNOSIS — E7800 Pure hypercholesterolemia, unspecified: Secondary | ICD-10-CM | POA: Diagnosis not present

## 2019-07-26 NOTE — Progress Notes (Signed)
Subjective:    Patient ID: Connie Bell, female    DOB: 1941/08/10, 78 y.o.   MRN: [id number]  HPI Here for f/u of labs  Wt Readings from Last 3 Encounters:  07/26/19 159 lb 6 oz (72.3 kg)  07/19/19 160 lb 9 oz (72.8 kg)  07/19/19 160 lb (72.6 kg)   28.23 kg/m   bp is stable today  No cp or palpitations or headaches or edema  No side effects to medicines  BP Readings from Last 3 Encounters:  07/26/19 (!) 142/72  07/19/19 140/80  12/07/18 (!) 145/80     Hyperlipidemia Lab Results  Component Value Date   CHOL 306 (H) 07/19/2019   CHOL 307 (H) 07/16/2018   CHOL 246 (H) 07/07/2017   Lab Results  Component Value Date   HDL 45.70 07/19/2019   HDL 46.10 07/16/2018   HDL 42.70 07/07/2017   No results found for: [facility] Lab Results  Component Value Date   TRIG (H) 07/19/2019    491.0 Triglyceride is over 400; calculations on Lipids are invalid.   TRIG (H) 07/16/2018    469.0 Triglyceride is over 400; calculations on Lipids are invalid.   TRIG 392.0 (H) 07/07/2017   Lab Results  Component Value Date   CHOLHDL 7 07/19/2019   CHOLHDL 7 07/16/2018   CHOLHDL 6 07/07/2017   Lab Results  Component Value Date   LDLDIRECT 151.0 07/19/2019   LDLDIRECT 164.0 07/16/2018   LDLDIRECT 140.0 07/07/2017   Intolerant of atorvastatin   Had eye exam in may  Has dry eyes /they stay red and this bothers her  It was a diabetic eye exam    New DM2 Lab Results  Component Value Date   HGBA1C 7.3 (H) 07/19/2019  glucose at the time of the draw was 100  No diabetes in the family  Pt wants to avoid medication  She walks 40 minutes per day  She used to eat sweets- recently stopped  She eats fruit instead  Lots of veggies and lean protein   Used to eat a lot of pasta /crackers   She gets very thirsty at night Feet hurt but no numbness   Patient Active Problem List   Diagnosis Date Noted  . Type 2 diabetes mellitus without complication, without long-term current use of  insulin ([place]) 07/26/2019  . Essential hypertension 11/23/2018  . Elevated glucose level 07/18/2018  . Routine general medical examination at a health care facility 07/07/2017  . Heme positive stool 08/08/2016  . Colon cancer screening 07/14/2016  . Family history of colon cancer 07/14/2016  . Pre-employment examination 08/26/2013  . Hemorrhoids 10/28/2011  . HEMORRHOIDS, INTERNAL 01/08/2011  . HYPERCHOLESTEROLEMIA 01/12/2008  . DEPRESSION 01/12/2008  . ROSACEA 01/12/2008   Past Medical History:  Diagnosis Date  . Depression   . Hyperlipemia    borderline not on Rx.  . Osteoporosis   . Rosacea   . Skin cancer    on nose and neck 2 times/basal cell   Past Surgical History:  Procedure Laterality Date  . COLONOSCOPY    . HEMORRHOID SURGERY     Pt passed out past surgery due to pain!  [facility] PARTIAL HYSTERECTOMY     secondary to abn pap  . Patient refuses     mammograms, chol treatment and vaccines  . POLYPECTOMY     Social History   Tobacco Use  . Smoking status: Never Smoker  . Smokeless tobacco: Never Used  Substance Use Topics  .  Alcohol use: No  . Drug use: No   Family History  Problem Relation Age of Onset  . Heart disease Mother        MI  . Cancer Sister 85       lung cancer  . Lung cancer Sister   . Colon cancer Brother 13       died at 64  . Heart disease Father        CHF  . Stomach cancer Maternal Aunt   . Colon polyps Neg Hx   . Esophageal cancer Neg Hx   . Rectal cancer Neg Hx   . Breast cancer Neg Hx    Allergies  Allergen Reactions  . Alendronate Sodium     REACTION: depressed, headaches, stomach aches  . Morphine     REACTION: /whelts  . Atorvastatin     REACTION: pt does not remember   Current Outpatient Medications on File Prior to Visit  Medication Sig Dispense Refill  . amLODipine (NORVASC) 5 MG tablet Take 1 tablet (5 mg total) by mouth daily. 30 tablet 11  . Cholecalciferol (VITAMIN D PO) Take 400 Units by mouth daily.    .  Multiple Vitamins-Minerals (VITAMIN D3 COMPLETE PO) Take 100 mcg by mouth daily.    . NON FORMULARY Doterra on Guard-Take one daily    . Omega-3 Fatty Acids (FISH OIL) 1000 MG CAPS Take 1pill by mouth daily     No current facility-administered medications on file prior to visit.      Review of Systems  Constitutional: Negative for activity change, appetite change, fatigue, fever and unexpected weight change.  HENT: Negative for congestion, ear pain, rhinorrhea, sinus pressure and sore throat.   Eyes: Negative for pain, redness and visual disturbance.  Respiratory: Negative for cough, shortness of breath and wheezing.   Cardiovascular: Negative for chest pain and palpitations.  Gastrointestinal: Negative for abdominal pain, blood in stool, constipation and diarrhea.  Endocrine: Positive for polydipsia and polyuria.  Genitourinary: Negative for dysuria, frequency and urgency.  Musculoskeletal: Negative for arthralgias, back pain and myalgias.  Skin: Negative for pallor and rash.  Allergic/Immunologic: Negative for environmental allergies.  Neurological: Negative for dizziness, syncope, numbness and headaches.  Hematological: Negative for adenopathy. Does not bruise/bleed easily.  Psychiatric/Behavioral: Negative for decreased concentration and dysphoric mood. The patient is not nervous/anxious.        Objective:   Physical Exam Constitutional:      General: She is not in acute distress.    Appearance: Normal appearance. She is well-developed and normal weight. She is not ill-appearing.     Comments: overweight  HENT:     Head: Normocephalic and atraumatic.     Mouth/Throat:     Mouth: Mucous membranes are moist.  Eyes:     General: No scleral icterus.    Conjunctiva/sclera: Conjunctivae normal.     Pupils: Pupils are equal, round, and reactive to light.  Neck:     Musculoskeletal: Normal range of motion and neck supple.     Thyroid: No thyromegaly.     Vascular: No carotid  bruit or JVD.  Cardiovascular:     Rate and Rhythm: Normal rate and regular rhythm.     Pulses: Normal pulses.     Heart sounds: Normal heart sounds. No gallop.   Pulmonary:     Effort: Pulmonary effort is normal. No respiratory distress.     Breath sounds: Normal breath sounds. No wheezing or rales.     Comments: Good  air exch Abdominal:     General: Bowel sounds are normal. There is no distension or abdominal bruit.     Palpations: Abdomen is soft. There is no mass.     Tenderness: There is no abdominal tenderness.     Hernia: No hernia is present.  Musculoskeletal:     Right lower leg: No edema.     Left lower leg: No edema.  Lymphadenopathy:     Cervical: No cervical adenopathy.  Skin:    General: Skin is warm and dry.     Findings: No rash.  Neurological:     Mental Status: She is alert. Mental status is at baseline.     Sensory: No sensory deficit.     Motor: No weakness.     Gait: Gait normal.     Deep Tendon Reflexes: Reflexes are normal and symmetric. Reflexes normal.  Psychiatric:        Mood and Affect: Mood normal.           Assessment & Plan:   Problem List Items Addressed This Visit      Cardiovascular and Mediastinum   Essential hypertension    bp is stable today  No cp or palpitations or headaches or edema  No side effects to medicines  BP Readings from Last 3 Encounters:  07/26/19 132/70  07/19/19 140/80  12/07/18 (!) 145/80    Better bp on 2nd check  If she remains diabetic will need to start ace/arb for renal protection         Endocrine   Type 2 diabetes mellitus without complication, without long-term current use of insulin (HCC) - Primary    New diagnosis Lab Results  Component Value Date   HGBA1C 7.3 (H) 07/19/2019   Disc impact of diabetes to vasc/neuro/opthy status  She declines metformin at this time-wants to start working on lifestyle change  Ref done to DM teaching  F/u 3 mo  Will likely need ace or arb for renal protection  Also statin (intol to atorvastatin in the past)  Enc her to get eye appt  Good foot care and exam Handouts given for DM eating and exercise       Relevant Orders   Ambulatory referral to diabetic education     Other   HYPERCHOLESTEROLEMIA    Disc goals for lipids and reasons to control them Rev last labs with pt Rev low sat fat diet in detail  Trig very high-in conj with DM2 Also high LDL Will work on diet/exercise and re check 3 mo  At that time will see if pt is open to trying a different statin        Relevant Orders   Ambulatory referral to diabetic education

## 2019-07-26 NOTE — Assessment & Plan Note (Addendum)
New diagnosis Lab Results  Component Value Date   HGBA1C 7.3 (H) 07/19/2019   Disc impact of diabetes to vasc/neuro/opthy status  She declines metformin at this time-wants to start working on lifestyle change  Ref done to DM teaching  F/u 3 mo  Will likely need ace or arb for renal protection Also statin (intol to atorvastatin in the past)  Enc her to get eye appt  Good foot care and exam Handouts given for DM eating and exercise

## 2019-07-26 NOTE — Assessment & Plan Note (Signed)
bp is stable today  No cp or palpitations or headaches or edema  No side effects to medicines  BP Readings from Last 3 Encounters:  07/26/19 132/70  07/19/19 140/80  12/07/18 (!) 145/80    Better bp on 2nd check  If she remains diabetic will need to start ace/arb for renal protection

## 2019-07-26 NOTE — Assessment & Plan Note (Signed)
Disc goals for lipids and reasons to control them Rev last labs with pt Rev low sat fat diet in detail  Trig very high-in conj with DM2 Also high LDL Will work on diet/exercise and re check 3 mo  At that time will see if pt is open to trying a different statin

## 2019-07-26 NOTE — Patient Instructions (Addendum)
Try to get most of your carbohydrates from produce (with the exception of white potatoes)  Eat less bread/pasta/rice/snack foods/cereals/sweets and other items from the middle of the grocery store (processed carbs)  Overall goal is A1C under 6.5  Short term goal is under 7 -we will check in 3 months  Work on diet/exercise   Rules for diabetes:  Get blood pressure to 130s /80s or lower  We need to take medicine (ace/arb) - to protect kidneys from blood sugar Goal for cholesterol - is LDL under 70 and HDL over 40 with triglycerides around 150 or less  A statin is important to protect from vascular disease  A yearly diabetic eye exam is very important     Our office will call you about a referral to diabetic teaching

## 2019-08-17 ENCOUNTER — Ambulatory Visit
Admission: RE | Admit: 2019-08-17 | Discharge: 2019-08-17 | Disposition: A | Discharge status: Home / Self Care | Payer: Medicare Other | Source: Ambulatory Visit | Attending: Family Medicine | Admitting: Family Medicine

## 2019-08-17 ENCOUNTER — Other Ambulatory Visit: Payer: Self-pay

## 2019-08-17 DIAGNOSIS — Z1231 Encounter for screening mammogram for malignant neoplasm of breast: Secondary | ICD-10-CM | POA: Diagnosis not present

## 2019-08-18 ENCOUNTER — Other Ambulatory Visit: Payer: Self-pay | Admitting: Family Medicine

## 2019-08-18 DIAGNOSIS — R928 Other abnormal and inconclusive findings on diagnostic imaging of breast: Secondary | ICD-10-CM

## 2019-08-25 ENCOUNTER — Other Ambulatory Visit: Payer: Self-pay

## 2019-08-25 ENCOUNTER — Other Ambulatory Visit: Payer: Self-pay | Admitting: Family Medicine

## 2019-08-25 ENCOUNTER — Ambulatory Visit
Admission: RE | Admit: 2019-08-25 | Discharge: 2019-08-25 | Disposition: A | Discharge status: Home / Self Care | Payer: Medicare Other | Source: Ambulatory Visit | Attending: Family Medicine | Admitting: Family Medicine

## 2019-08-25 DIAGNOSIS — R928 Other abnormal and inconclusive findings on diagnostic imaging of breast: Secondary | ICD-10-CM

## 2019-08-25 DIAGNOSIS — R921 Mammographic calcification found on diagnostic imaging of breast: Secondary | ICD-10-CM | POA: Diagnosis not present

## 2019-10-13 ENCOUNTER — Other Ambulatory Visit: Payer: Self-pay

## 2019-10-13 ENCOUNTER — Emergency Department (HOSPITAL_COMMUNITY): Payer: Medicare Other

## 2019-10-13 ENCOUNTER — Emergency Department (HOSPITAL_COMMUNITY)
Admission: EM | Admit: 2019-10-13 | Discharge: 2019-10-13 | Disposition: A | Discharge status: Home / Self Care | Payer: Medicare Other | Attending: Emergency Medicine | Admitting: Emergency Medicine

## 2019-10-13 DIAGNOSIS — U071 COVID-19: Secondary | ICD-10-CM | POA: Insufficient documentation

## 2019-10-13 DIAGNOSIS — R05 Cough: Secondary | ICD-10-CM | POA: Diagnosis not present

## 2019-10-13 DIAGNOSIS — N3000 Acute cystitis without hematuria: Secondary | ICD-10-CM

## 2019-10-13 DIAGNOSIS — Z85828 Personal history of other malignant neoplasm of skin: Secondary | ICD-10-CM | POA: Insufficient documentation

## 2019-10-13 DIAGNOSIS — R197 Diarrhea, unspecified: Secondary | ICD-10-CM | POA: Diagnosis not present

## 2019-10-13 DIAGNOSIS — R55 Syncope and collapse: Secondary | ICD-10-CM | POA: Diagnosis not present

## 2019-10-13 DIAGNOSIS — E119 Type 2 diabetes mellitus without complications: Secondary | ICD-10-CM | POA: Diagnosis not present

## 2019-10-13 DIAGNOSIS — Z20822 Contact with and (suspected) exposure to covid-19: Secondary | ICD-10-CM

## 2019-10-13 DIAGNOSIS — Z20828 Contact with and (suspected) exposure to other viral communicable diseases: Secondary | ICD-10-CM

## 2019-10-13 LAB — URINALYSIS, ROUTINE W REFLEX MICROSCOPIC
Bilirubin Urine: NEGATIVE
Glucose, UA: NEGATIVE mg/dL
Hgb urine dipstick: NEGATIVE
Ketones, ur: NEGATIVE mg/dL
Nitrite: NEGATIVE
Protein, ur: NEGATIVE mg/dL
Specific Gravity, Urine: 1.017 (ref 1.005–1.030)
pH: 5 (ref 5.0–8.0)

## 2019-10-13 LAB — TRIGLYCERIDES: Triglycerides: 97 mg/dL (ref <150)

## 2019-10-13 LAB — LACTATE DEHYDROGENASE: LDH: 141 U/L (ref 98–192)

## 2019-10-13 LAB — BASIC METABOLIC PANEL
Anion gap: 10 (ref 5–15)
BUN: 19 mg/dL (ref 8–23)
CO2: 24 mmol/L (ref 22–32)
Calcium: 8.9 mg/dL (ref 8.9–10.3)
Chloride: 100 mmol/L (ref 98–111)
Creatinine, Ser: 0.99 mg/dL (ref 0.44–1.00)
GFR calc Af Amer: >60 mL/min (ref >60)
GFR calc non Af Amer: 55 mL/min — ABNORMAL LOW (ref >60)
Glucose, Bld: 117 mg/dL — ABNORMAL HIGH (ref 70–99)
Potassium: 4.3 mmol/L (ref 3.5–5.1)
Sodium: 134 mmol/L — ABNORMAL LOW (ref 135–145)

## 2019-10-13 LAB — PROCALCITONIN: Procalcitonin: <0.1 ng/mL

## 2019-10-13 LAB — CBC
HCT: 40.8 % (ref 36.0–46.0)
Hemoglobin: 13.4 g/dL (ref 12.0–15.0)
MCH: 31.4 pg (ref 26.0–34.0)
MCHC: 32.8 g/dL (ref 30.0–36.0)
MCV: 95.6 fL (ref 80.0–100.0)
Platelets: 294 10*3/uL (ref 150–400)
RBC: 4.27 MIL/uL (ref 3.87–5.11)
RDW: 13.9 % (ref 11.5–15.5)
WBC: 7.1 10*3/uL (ref 4.0–10.5)
nRBC: 0 % (ref 0.0–0.2)

## 2019-10-13 LAB — TROPONIN I (HIGH SENSITIVITY)
Troponin I (High Sensitivity): 3 ng/L (ref <18)
Troponin I (High Sensitivity): <2 ng/L (ref <18)

## 2019-10-13 LAB — FERRITIN: Ferritin: 132 ng/mL (ref 11–307)

## 2019-10-13 LAB — CBG MONITORING, ED: Glucose-Capillary: 112 mg/dL — ABNORMAL HIGH (ref 70–99)

## 2019-10-13 LAB — C-REACTIVE PROTEIN: CRP: <0.8 mg/dL (ref <1.0)

## 2019-10-13 LAB — SARS CORONAVIRUS 2 (TAT 6-24 HRS): SARS Coronavirus 2: POSITIVE — AB

## 2019-10-13 LAB — D-DIMER, QUANTITATIVE: D-Dimer, Quant: 1.05 ug/mL-FEU — ABNORMAL HIGH (ref 0.00–0.50)

## 2019-10-13 LAB — FIBRINOGEN: Fibrinogen: 428 mg/dL (ref 210–475)

## 2019-10-13 MED ORDER — CEPHALEXIN 250 MG PO CAPS
1000.0000 mg | ORAL_CAPSULE | Freq: Once | ORAL | Status: AC
  Administered 2019-10-13: 1000 mg via ORAL
  Filled 2019-10-13: qty 4

## 2019-10-13 MED ORDER — CEPHALEXIN 500 MG PO CAPS: 1000.0000 mg | ORAL_CAPSULE | Freq: Two times a day (BID) | ORAL | 0 refills | Status: DC

## 2019-10-13 NOTE — ED Provider Notes (Signed)
[facility] EMERGENCY DEPARTMENT Provider Note   CSN: [id number] Arrival date & time: 10/13/19  1456     History   Chief Complaint Chief Complaint  Patient presents with  . Loss of Consciousness  . Cough  . Diarrhea    HPI Connie Bell is a 78 y.o. female.     HPI Patient reports that she passed out prior to arrival.  She had been standing at her sink doing dishes.  She reports she been standing there for about 10 minutes.  She reports she does have a history of vertigo.  Patient reports that she just briefly felt a little dizzy then passed out and fell to the floor.  She denies that she feels like she sustained any injury.  She reports that as soon as she came back around however, she had about 10 minutes of copious diarrhea.  She denies that she was having any problems with diarrhea leading up to this episode.  He denies there was any associated abdominal pain.  She did not vomit.  She reports now she feels back to normal.  Patient had eaten an egg and some blueberries and orange juice for breakfast this morning.  Patient reports that her family members were concerned because she does have a exposure to someone who tested positive for Covid.  Her contact with that person was 6 days ago.  She reports she has had 3 days of dry cough.  She has not had any associated chest pain and has not felt short of breath.  She has not measured fever.  She has not felt like she has had chills or myalgia or general weakness.  She reports aside from the dry cough, she is actually felt fine.  No pain or swelling of the lower extremities. Past Medical History:  Diagnosis Date  . Depression   . Hyperlipemia    borderline not on Rx.  . Osteoporosis   . Rosacea   . Skin cancer    on nose and neck 2 times/basal cell    Patient Active Problem List   Diagnosis Date Noted  . Type 2 diabetes mellitus without complication, without long-term current use of insulin ([place]) 07/26/2019  .  Essential hypertension 11/23/2018  . Elevated glucose level 07/18/2018  . Routine general medical examination at a health care facility 07/07/2017  . Heme positive stool 08/08/2016  . Colon cancer screening 07/14/2016  . Family history of colon cancer 07/14/2016  . Pre-employment examination 08/26/2013  . Hemorrhoids 10/28/2011  . HEMORRHOIDS, INTERNAL 01/08/2011  . HYPERCHOLESTEROLEMIA 01/12/2008  . DEPRESSION 01/12/2008  . ROSACEA 01/12/2008    Past Surgical History:  Procedure Laterality Date  . COLONOSCOPY    . HEMORRHOID SURGERY     Pt passed out past surgery due to pain!  Marland Kitchen PARTIAL HYSTERECTOMY     secondary to abn pap  . Patient refuses     mammograms, chol treatment and vaccines  . POLYPECTOMY       OB History   No obstetric history on file.      Home Medications    Prior to Admission medications   Medication Sig Start Date End Date Taking? Authorizing Provider  amLODipine (NORVASC) 5 MG tablet Take 1 tablet (5 mg total) by mouth daily. 12/07/18  Yes Tower, Wynelle Fanny, MD  Ascorbic Acid (VITAMIN C PO) Take by mouth.   Yes [provider]  ELDERBERRY PO Take by mouth.   Yes [provider]  ibuprofen (ADVIL)  200 MG tablet Take 200 mg by mouth daily as needed for mild pain.   Yes [provider]  Menthol (HALLS COUGH DROPS MT) Use as directed 1 lozenge in the mouth or throat daily as needed (cough).   Yes [provider]  Multiple Vitamin (MULTIVITAMIN WITH MINERALS) TABS tablet Take 1 tablet by mouth daily.   Yes [provider]  Multiple Vitamins-Minerals (VITAMIN D3 COMPLETE PO) Take 100 mcg by mouth daily.   Yes [provider]  NON Sondra Barges on Guard-Take one daily   Yes [provider]  Omega-3 Fatty Acids (FISH OIL) 1000 MG CAPS Take 1pill by mouth daily   Yes [provider]  cephALEXin (KEFLEX) 500 MG capsule Take 2 capsules (1,000 mg total) by mouth 2 (two) times daily. 10/13/19    Charlesetta Shanks, MD    Family History Family History  Problem Relation Age of Onset  . Heart disease Mother        MI  . Cancer Sister 62       lung cancer  . Lung cancer Sister   . Colon cancer Brother 47       died at 32  . Heart disease Father        CHF  . Stomach cancer Maternal Aunt   . Colon polyps Neg Hx   . Esophageal cancer Neg Hx   . Rectal cancer Neg Hx   . Breast cancer Neg Hx     Social History Social History   Tobacco Use  . Smoking status: Never Smoker  . Smokeless tobacco: Never Used  Substance Use Topics  . Alcohol use: No  . Drug use: No     Allergies   Alendronate sodium, Morphine, and Atorvastatin   Review of Systems Review of Systems 10 Systems reviewed and are negative for acute change except as noted in the HPI.   Physical Exam Updated Vital Signs BP 125/64   Pulse 64   Temp 99.4 F (37.4 C) (Oral)   Resp 18   SpO2 96%   Physical Exam Constitutional:      Appearance: Normal appearance. She is well-developed.     Comments: Well in appearance.  No respiratory distress.  Nontoxic.  HENT:     Head: Normocephalic and atraumatic.  Eyes:     Extraocular Movements: Extraocular movements intact.     Conjunctiva/sclera: Conjunctivae normal.     Pupils: Pupils are equal, round, and reactive to light.  Neck:     Musculoskeletal: Neck supple.  Cardiovascular:     Rate and Rhythm: Normal rate and regular rhythm.     Heart sounds: Normal heart sounds.  Pulmonary:     Effort: Pulmonary effort is normal.     Breath sounds: Normal breath sounds.  Abdominal:     General: Bowel sounds are normal. There is no distension.     Palpations: Abdomen is soft.     Tenderness: There is no abdominal tenderness.  Musculoskeletal: Normal range of motion.        General: No swelling, tenderness, deformity or signs of injury.     Right lower leg: No edema.     Left lower leg: No edema.  Skin:    General: Skin is warm and dry.  Neurological:      General: No focal deficit present.     Mental Status: She is alert and oriented to person, place, and time.     GCS: GCS eye subscore is 4. GCS verbal  subscore is 5. GCS motor subscore is 6.     Cranial Nerves: No cranial nerve deficit.     Motor: No weakness.     Coordination: Coordination normal.  Psychiatric:        Mood and Affect: Mood normal.      ED Treatments / Results  Labs (all labs ordered are listed, but only abnormal results are displayed) Labs Reviewed  BASIC METABOLIC PANEL - Abnormal; Notable for the following components:      Result Value   Sodium 134 (*)    Glucose, Bld 117 (*)    GFR calc non Af Amer 55 (*)    All other components within normal limits  URINALYSIS, ROUTINE W REFLEX MICROSCOPIC - Abnormal; Notable for the following components:   APPearance HAZY (*)    Leukocytes,Ua LARGE (*)    Bacteria, UA RARE (*)    Non Squamous Epithelial 0-5 (*)    All other components within normal limits  D-DIMER, QUANTITATIVE (NOT AT [facility]) - Abnormal; Notable for the following components:   D-Dimer, Quant 1.05 (*)    All other components within normal limits  CBG MONITORING, ED - Abnormal; Notable for the following components:   Glucose-Capillary 112 (*)    All other components within normal limits  SARS CORONAVIRUS 2 (TAT 6-24 HRS)  URINE CULTURE  CBC  PROCALCITONIN  LACTATE DEHYDROGENASE  FERRITIN  TRIGLYCERIDES  FIBRINOGEN  C-REACTIVE PROTEIN  TROPONIN I (HIGH SENSITIVITY)  TROPONIN I (HIGH SENSITIVITY)    EKG EKG Interpretation  Date/Time:  Thursday October 13 2019 15:06:49 EST Ventricular Rate:  74 PR Interval:  196 QRS Duration: 70 QT Interval:  384 QTC Calculation: 426 R Axis:   27 Text Interpretation: Normal sinus rhythm Nonspecific T wave abnormality Abnormal ECG no change from previous Confirmed by Charlesetta Shanks [phone]) on 10/13/2019 4:12:53 PM   Radiology Dg Chest Port 1 View  Result Date: 10/13/2019 CLINICAL DATA:  Cough. EXAM:  PORTABLE CHEST 1 VIEW COMPARISON:  01/10/2008 FINDINGS: Cardiac silhouette is normal in size. No mediastinal or hilar masses. No evidence of adenopathy. Lungs are hyperexpanded with prominent bronchovascular markings. No consolidation to suggest pneumonia. No evidence of pulmonary edema. No pleural effusion or pneumothorax. Skeletal structures are grossly intact. IMPRESSION: No active disease. Electronically Signed   By: Lajean Manes M.D.   On: 10/13/2019 17:24    Procedures Procedures (including critical care time)  Medications Ordered in ED Medications  cephALEXin (KEFLEX) capsule 1,000 mg (has no administration in time range)     Initial Impression / Assessment and Plan / ED Course  I have reviewed the triage vital signs and the nursing notes.  Pertinent labs & imaging results that were available during my care of the patient were reviewed by me and considered in my medical decision making (see chart for details).       Connie Bell was evaluated in Emergency Department on 10/13/2019 for the symptoms described in the history of present illness. She was evaluated in the context of the global COVID-19 pandemic, which necessitated consideration that the patient might be at risk for infection with the SARS-CoV-2 virus that causes COVID-19. Institutional protocols and algorithms that pertain to the evaluation of patients at risk for COVID-19 are in a state of rapid change based on information released by regulatory bodies including the CDC and federal and state organizations. These policies and algorithms were followed during the patient's care in the ED.  Patient has had several days of mild  cough.  She has had Covid exposure.  At this time she has no hypoxia, stable vital signs and no elevation in inflammatory markers for Covid.  Do feel she is stable for continued home management with careful observation and outpatient follow-up.  Patient does have findings of UTI on urinalysis.  I will opt  to treat with Keflex with culture pending.  Return precautions are reviewed.  Patient did not have chest pain or dyspnea.  I have low suspicion for cardiopulmonary etiology.  At this time, patient is stable for discharge.  Return precautions are reviewed.    Final Clinical Impressions(s) / ED Diagnoses   Final diagnoses:  Syncope and collapse  Exposure to COVID-19 virus  Acute cystitis without hematuria    ED Discharge Orders         Ordered    cephALEXin (KEFLEX) 500 MG capsule  2 times daily     10/13/19 1959           Charlesetta Shanks, MD 10/13/19 2004

## 2019-10-13 NOTE — ED Triage Notes (Signed)
Pt sts she's had a dry cough x 3 days but was feeling very well when she got up this morning. Then around 1200 this afternoon pt had a syncopal episode, passed out in the kitchen floor, out for about 5 mins, and then had diarrhea when she woke back up. Pt denies injury/pain from fall and is not on blood thinners, however she was dead weight falling to the floor. Pt has no complaints other than feeling weak. Pt endorses being around people dx with covid.

## 2019-10-13 NOTE — ED Notes (Signed)
Pt daughter [name] [phone]

## 2019-10-13 NOTE — Discharge Instructions (Signed)
1.  Follow-up with your doctor as soon as possible.  You will have results for a urine culture and a Covid test within the next 1 to 3 days. 2.  Take Keflex as prescribed. 3.  Return to the emergency department if you have any concerning symptoms.

## 2019-10-14 ENCOUNTER — Telehealth: Payer: Self-pay | Admitting: Unknown Physician Specialty

## 2019-10-14 LAB — URINE CULTURE: Culture: 10000 — AB

## 2019-10-14 NOTE — Telephone Encounter (Signed)
Called and left message to discuss if she qualifies and would benefit by monoclonal antibiody treatment.

## 2019-10-15 ENCOUNTER — Telehealth: Payer: Self-pay | Admitting: Emergency Medicine

## 2019-10-15 NOTE — Telephone Encounter (Signed)
Post ED Visit - Positive Culture Follow-up  Culture report reviewed by antimicrobial stewardship pharmacist: [place] Team []  [address], Pharm.D. []  Heide Guile, Pharm.D., BCPS AQ-ID []  Parks Neptune, Pharm.D., BCPS []  Alycia Rossetti, Pharm.D., BCPS []  [place], Pharm.D., BCPS, AAHIVP []  Legrand Como, Pharm.D., BCPS, AAHIVP []  Salome Arnt, PharmD, BCPS []  Johnnette Gourd, PharmD, BCPS []  Hughes Better, PharmD, BCPS [x]  Duanne Limerick, PharmD []  Laqueta Linden, PharmD, BCPS []  Albertina Parr, PharmD  [place] Team []  Leodis Sias, PharmD []  Lindell Spar, PharmD []  Royetta Asal, PharmD []  Graylin Shiver, Rph []  Rema Fendt) Glennon Mac, PharmD []  Arlyn Dunning, PharmD []  Netta Cedars, PharmD []  Dia Sitter, PharmD []  Leone Haven, PharmD []  Gretta Arab, PharmD []  Theodis Shove, PharmD []  Peggyann Juba, PharmD []  Reuel Boom, PharmD   Positive urine culture Treated with Cephalexin, organism sensitive to the same and no further patient follow-up is required at this time.  Connie Bell 10/15/2019, 2:17 PM

## 2019-10-17 ENCOUNTER — Telehealth: Payer: Self-pay | Admitting: *Deleted

## 2019-10-17 MED ORDER — ONDANSETRON HCL 8 MG PO TABS: 8.0000 mg | ORAL_TABLET | Freq: Three times a day (TID) | ORAL | 0 refills | Status: DC | PRN

## 2019-10-17 NOTE — Telephone Encounter (Signed)
The inst on her keflex is 2 pills bid Decrease it to one pill bid instead (this should be enough) and take it for 3 more days  If she wants [place] to send in nausea medicine please let us know  Change her f/u appt to 3-4 weeks later than planned   Let us know how she is feeling later this week so we can keep watching

## 2019-10-17 NOTE — Telephone Encounter (Signed)
I sent in zofran.

## 2019-10-17 NOTE — Telephone Encounter (Signed)
Patient left voicemail stating that she went to the hospital last Thursday and was diagnosed with Covid and a UTI. Patient stated that she has an upcoming appointment scheduled with you on 10/25/19 and wants to know if she should keep that? Patient stated the medication they gave her for the UTI is making or sick to her stomach or it could be the Covid. Patient stated that she was told to take the antibiotic two twice a day. Patient wants to know if it is okay to stop the medication or cut back on it since she is getting sick on her stomach?

## 2019-10-17 NOTE — Telephone Encounter (Signed)
Spoke with patient.  She would like something for nausea called in to [facility].  She will decrease abx to 1 tab BID for 3 more days.  Rescheduled appointment.

## 2019-10-17 NOTE — Telephone Encounter (Signed)
Noted  

## 2019-10-20 ENCOUNTER — Encounter (HOSPITAL_COMMUNITY): Payer: Self-pay | Admitting: Emergency Medicine

## 2019-10-20 ENCOUNTER — Other Ambulatory Visit: Payer: Self-pay

## 2019-10-20 ENCOUNTER — Emergency Department (HOSPITAL_COMMUNITY): Payer: Medicare Other

## 2019-10-20 ENCOUNTER — Emergency Department (HOSPITAL_COMMUNITY)
Admission: EM | Admit: 2019-10-20 | Discharge: 2019-10-20 | Disposition: A | Discharge status: Home / Self Care | Payer: Medicare Other | Attending: Emergency Medicine | Admitting: Emergency Medicine

## 2019-10-20 DIAGNOSIS — I10 Essential (primary) hypertension: Secondary | ICD-10-CM | POA: Diagnosis not present

## 2019-10-20 DIAGNOSIS — Z79899 Other long term (current) drug therapy: Secondary | ICD-10-CM | POA: Diagnosis not present

## 2019-10-20 DIAGNOSIS — R55 Syncope and collapse: Secondary | ICD-10-CM | POA: Diagnosis not present

## 2019-10-20 DIAGNOSIS — R0602 Shortness of breath: Secondary | ICD-10-CM | POA: Diagnosis not present

## 2019-10-20 DIAGNOSIS — U071 COVID-19: Secondary | ICD-10-CM | POA: Insufficient documentation

## 2019-10-20 DIAGNOSIS — Z85828 Personal history of other malignant neoplasm of skin: Secondary | ICD-10-CM | POA: Insufficient documentation

## 2019-10-20 DIAGNOSIS — R05 Cough: Secondary | ICD-10-CM | POA: Diagnosis not present

## 2019-10-20 HISTORY — DX: COVID-19: U07.1

## 2019-10-20 LAB — CBC
HCT: 37.1 % (ref 36.0–46.0)
Hemoglobin: 11.9 g/dL — ABNORMAL LOW (ref 12.0–15.0)
MCH: 30.8 pg (ref 26.0–34.0)
MCHC: 32.1 g/dL (ref 30.0–36.0)
MCV: 96.1 fL (ref 80.0–100.0)
Platelets: 267 10*3/uL (ref 150–400)
RBC: 3.86 MIL/uL — ABNORMAL LOW (ref 3.87–5.11)
RDW: 14.2 % (ref 11.5–15.5)
WBC: 5.1 10*3/uL (ref 4.0–10.5)
nRBC: 0 % (ref 0.0–0.2)

## 2019-10-20 LAB — BASIC METABOLIC PANEL WITH GFR
Anion gap: 9 (ref 5–15)
BUN: 15 mg/dL (ref 8–23)
CO2: 26 mmol/L (ref 22–32)
Calcium: 8.6 mg/dL — ABNORMAL LOW (ref 8.9–10.3)
Chloride: 100 mmol/L (ref 98–111)
Creatinine, Ser: 0.77 mg/dL (ref 0.44–1.00)
GFR calc Af Amer: >60 mL/min
GFR calc non Af Amer: >60 mL/min
Glucose, Bld: 119 mg/dL — ABNORMAL HIGH (ref 70–99)
Potassium: 4.3 mmol/L (ref 3.5–5.1)
Sodium: 135 mmol/L (ref 135–145)

## 2019-10-20 LAB — TROPONIN I (HIGH SENSITIVITY): Troponin I (High Sensitivity): 3 ng/L (ref <18)

## 2019-10-20 LAB — CBG MONITORING, ED: Glucose-Capillary: 108 mg/dL — ABNORMAL HIGH (ref 70–99)

## 2019-10-20 MED ORDER — SODIUM CHLORIDE 0.9% FLUSH
3.0000 mL | Freq: Once | INTRAVENOUS | Status: AC
  Administered 2019-10-20: 09:00:00 3 mL via INTRAVENOUS

## 2019-10-20 MED ORDER — SODIUM CHLORIDE 0.9 % IV BOLUS
1000.0000 mL | Freq: Once | INTRAVENOUS | Status: AC
  Administered 2019-10-20: 1000 mL via INTRAVENOUS

## 2019-10-20 NOTE — ED Triage Notes (Signed)
Pt states she was diagnosed with COVID last week and felt like she was getting better until having 2 syncopal episodes last night.  C/o mild SOB and pain under L breast from fall.

## 2019-10-20 NOTE — ED Provider Notes (Signed)
[facility] EMERGENCY DEPARTMENT Provider Note   CSN: [id number] Arrival date & time: 10/20/19  [id number]     History   Chief Complaint Chief Complaint  Patient presents with  . Loss of Consciousness    COVID +    HPI Connie Bell is a 78 y.o. female.     Patient is a 78 year old female with past medical history of hypertension, hyperlipidemia, and recent diagnosis of COVID-19 infection.  Patient presents today for evaluation of syncope.  Patient states that she got up to walk to the kitchen last night.  On her way there, she felt lightheaded, then had a syncopal episode.  She was able to grab a cabinet door and lowered herself to the ground.  She denies any definite injury during the fall, but does complain of some pain to the left lateral chest.  She was able to get up, but experienced a second syncopal episode on her way back to the bedroom.  She denies any difficulty breathing.  She denies any fevers or chills.  She spoke to her son this morning and informed him of what had happened and he recommended she come here to be evaluated.  The history is provided by the patient.  Loss of Consciousness Episode history:  Multiple Most recent episode:  Yesterday Progression:  Resolved Chronicity:  New Witnessed: no   Relieved by:  Nothing Worsened by:  Nothing Ineffective treatments:  None tried   Past Medical History:  Diagnosis Date  . COVID-19   . Depression   . Hyperlipemia    borderline not on Rx.  . Osteoporosis   . Rosacea   . Skin cancer    on nose and neck 2 times/basal cell    Patient Active Problem List   Diagnosis Date Noted  . Type 2 diabetes mellitus without complication, without long-term current use of insulin ([place]) 07/26/2019  . Essential hypertension 11/23/2018  . Elevated glucose level 07/18/2018  . Routine general medical examination at a health care facility 07/07/2017  . Heme positive stool 08/08/2016  . Colon cancer screening  07/14/2016  . Family history of colon cancer 07/14/2016  . Pre-employment examination 08/26/2013  . Hemorrhoids 10/28/2011  . HEMORRHOIDS, INTERNAL 01/08/2011  . HYPERCHOLESTEROLEMIA 01/12/2008  . DEPRESSION 01/12/2008  . ROSACEA 01/12/2008    Past Surgical History:  Procedure Laterality Date  . COLONOSCOPY    . HEMORRHOID SURGERY     Pt passed out past surgery due to pain!  Marland Kitchen PARTIAL HYSTERECTOMY     secondary to abn pap  . Patient refuses     mammograms, chol treatment and vaccines  . POLYPECTOMY       OB History   No obstetric history on file.      Home Medications    Prior to Admission medications   Medication Sig Start Date End Date Taking? Authorizing Provider  amLODipine (NORVASC) 5 MG tablet Take 1 tablet (5 mg total) by mouth daily. 12/07/18   Tower, Wynelle Fanny, MD  Ascorbic Acid (VITAMIN C PO) Take by mouth.    [provider]  cephALEXin (KEFLEX) 500 MG capsule Take 2 capsules (1,000 mg total) by mouth 2 (two) times daily. 10/13/19   Charlesetta Shanks, MD  ELDERBERRY PO Take by mouth.    [provider]  ibuprofen (ADVIL) 200 MG tablet Take 200 mg by mouth daily as needed for mild pain.    [provider]  Menthol (HALLS COUGH DROPS MT) Use as directed 1  lozenge in the mouth or throat daily as needed (cough).    [provider]  Multiple Vitamin (MULTIVITAMIN WITH MINERALS) TABS tablet Take 1 tablet by mouth daily.    [provider]  Multiple Vitamins-Minerals (VITAMIN D3 COMPLETE PO) Take 100 mcg by mouth daily.    [provider]  NON Sondra Barges on Guard-Take one daily    [provider]  Omega-3 Fatty Acids (FISH OIL) 1000 MG CAPS Take 1pill by mouth daily    [provider]  ondansetron (ZOFRAN) 8 MG tablet Take 1 tablet (8 mg total) by mouth every 8 (eight) hours as needed for nausea or vomiting. 10/17/19   Tower, Wynelle Fanny, MD    Family History Family History  Problem Relation Age  of Onset  . Heart disease Mother        MI  . Cancer Sister 72       lung cancer  . Lung cancer Sister   . Colon cancer Brother 11       died at 60  . Heart disease Father        CHF  . Stomach cancer Maternal Aunt   . Colon polyps Neg Hx   . Esophageal cancer Neg Hx   . Rectal cancer Neg Hx   . Breast cancer Neg Hx     Social History Social History   Tobacco Use  . Smoking status: Never Smoker  . Smokeless tobacco: Never Used  Substance Use Topics  . Alcohol use: No  . Drug use: No     Allergies   Alendronate sodium, Morphine, and Atorvastatin   Review of Systems Review of Systems  Cardiovascular: Positive for syncope.  All other systems reviewed and are negative.    Physical Exam Updated Vital Signs BP 125/62   Pulse 70   Temp 98.6 F (37 C) (Oral)   Resp 14   SpO2 96%   Physical Exam Vitals signs and nursing note reviewed.  Constitutional:      General: She is not in acute distress.    Appearance: She is well-developed. She is not diaphoretic.  HENT:     Head: Normocephalic and atraumatic.  Eyes:     Extraocular Movements: Extraocular movements intact.     Pupils: Pupils are equal, round, and reactive to light.  Neck:     Musculoskeletal: Normal range of motion and neck supple.  Cardiovascular:     Rate and Rhythm: Normal rate and regular rhythm.     Heart sounds: No murmur. No friction rub. No gallop.   Pulmonary:     Effort: Pulmonary effort is normal. No respiratory distress.     Breath sounds: Normal breath sounds. No wheezing.  Abdominal:     General: Bowel sounds are normal. There is no distension.     Palpations: Abdomen is soft.     Tenderness: There is no abdominal tenderness.  Musculoskeletal: Normal range of motion.  Skin:    General: Skin is warm and dry.  Neurological:     General: No focal deficit present.     Mental Status: She is alert and oriented to person, place, and time.     Cranial Nerves: No cranial nerve deficit.      Motor: No weakness.     Coordination: Coordination normal.      ED Treatments / Results  Labs (all labs ordered are listed, but only abnormal results are displayed) Labs Reviewed  CBC - Abnormal; Notable for the following components:  Result Value   RBC 3.86 (*)    Hemoglobin 11.9 (*)    All other components within normal limits  CBG MONITORING, ED - Abnormal; Notable for the following components:   Glucose-Capillary 108 (*)    All other components within normal limits  BASIC METABOLIC PANEL  URINALYSIS, ROUTINE W REFLEX MICROSCOPIC  TROPONIN I (HIGH SENSITIVITY)    EKG EKG Interpretation  Date/Time:  Thursday October 20 2019 07:41:07 EST Ventricular Rate:  73 PR Interval:  202 QRS Duration: 72 QT Interval:  394 QTC Calculation: 434 R Axis:   12 Text Interpretation: Normal sinus rhythm Nonspecific T wave abnormality Abnormal ECG No significant change since 10/13/2019 Confirmed by Veryl Speak [phone]) on 10/20/2019 9:47:14 AM   Radiology No results found.  Procedures Procedures (including critical care time)  Medications Ordered in ED Medications  sodium chloride flush (NS) 0.9 % injection 3 mL (has no administration in time range)  sodium chloride 0.9 % bolus 1,000 mL (has no administration in time range)     Initial Impression / Assessment and Plan / ED Course  I have reviewed the triage vital signs and the nursing notes.  Pertinent labs & imaging results that were available during my care of the patient were reviewed by me and considered in my medical decision making (see chart for details).  Patient is a 78 year old female presenting with complaints of syncope x2 and recent diagnosis of COVID-19.  Patient's vital signs are stable and she is afebrile.  Oxygen saturations are in the upper 90s on room air.  Her EKG shows a normal sinus rhythm that is unchanged from prior study 1 week ago.  She has been monitored for 2 hours and 30 minutes in the  emergency department and I have witnessed no ectopy.  I doubt a cardiac etiology to her syncope.  Laboratory studies show no evidence for anemia and electrolyte panel that is within normal limits.  I suspect the etiology of her syncope is vasovagal related.  This occurred when she stood to walk.  Patient also states that her appetite has been somewhat decreased and it is possible may be she is slightly dehydrated.  She was given a liter of normal saline.  I have considered pulmonary embolism, but doubt this to be the case.  She is not tachycardic, not tachypneic, and there is no hypoxia.  She has remained stable throughout her emergency department visit and I feel as though she is appropriate for discharge.  I have advised the patient to drink plenty of fluids, get plenty of rest, and return if symptoms worsen or change.    Final Clinical Impressions(s) / ED Diagnoses   Final diagnoses:  None    ED Discharge Orders    None       Veryl Speak, MD 10/20/19 1010

## 2019-10-20 NOTE — Discharge Instructions (Addendum)
Continue medications as previously prescribed.  Drink plenty of fluids and get plenty of rest.  Return to the emergency department if you develop chest pain, difficulty breathing, or other new and concerning symptoms.

## 2019-10-20 NOTE — ED Notes (Signed)
Patient verbalizes understanding of discharge instructions . Opportunity for questions and answers were provided . Armband removed by staff ,Pt discharged from ED. W/C  offered at D/C  and Declined W/C at D/C and was escorted to lobby by RN.  

## 2019-10-25 ENCOUNTER — Ambulatory Visit: Payer: Medicare Other | Admitting: Family Medicine

## 2019-11-15 ENCOUNTER — Ambulatory Visit: Payer: Medicare Other | Admitting: Family Medicine

## 2019-11-24 ENCOUNTER — Encounter: Payer: Self-pay | Admitting: Family Medicine

## 2019-11-24 ENCOUNTER — Ambulatory Visit (INDEPENDENT_AMBULATORY_CARE_PROVIDER_SITE_OTHER): Payer: Medicare Other | Admitting: Family Medicine

## 2019-11-24 ENCOUNTER — Other Ambulatory Visit: Payer: Self-pay

## 2019-11-24 VITALS — BP 124/76 | HR 78 | Temp 97.3°F | Ht 63.0 in | Wt 139.0 lb

## 2019-11-24 DIAGNOSIS — E1169 Type 2 diabetes mellitus with other specified complication: Secondary | ICD-10-CM

## 2019-11-24 DIAGNOSIS — E119 Type 2 diabetes mellitus without complications: Secondary | ICD-10-CM

## 2019-11-24 DIAGNOSIS — E785 Hyperlipidemia, unspecified: Secondary | ICD-10-CM

## 2019-11-24 DIAGNOSIS — Z8616 Personal history of COVID-19: Secondary | ICD-10-CM | POA: Insufficient documentation

## 2019-11-24 DIAGNOSIS — I10 Essential (primary) hypertension: Secondary | ICD-10-CM | POA: Diagnosis not present

## 2019-11-24 LAB — POCT GLYCOSYLATED HEMOGLOBIN (HGB A1C): Hemoglobin A1C: 6.4 % — AB (ref 4.0–5.6)

## 2019-11-24 LAB — MICROALBUMIN / CREATININE URINE RATIO
Creatinine,U: 65.6 mg/dL
Microalb Creat Ratio: 1.1 mg/g (ref 0.0–30.0)
Microalb, Ur: 0.7 mg/dL (ref 0.0–1.9)

## 2019-11-24 MED ORDER — ROSUVASTATIN CALCIUM 5 MG PO TABS: ORAL_TABLET | ORAL | 11 refills | Status: DC

## 2019-11-24 NOTE — Assessment & Plan Note (Signed)
Disc goals for lipids and reasons to control them Rev last labs with pt Rev low sat fat diet in detail Pt has agreed to try low dose crestor 5 mg twice weekly  inst to stop medication and call if any side effects  Re check 3 mo

## 2019-11-24 NOTE — Progress Notes (Signed)
Subjective:    Patient ID: Connie Bell, female    DOB: July 07, 1941, 79 y.o.   MRN: [id number]  This visit occurred during the SARS-CoV-2 public health emergency.  Safety protocols were in place, including screening questions prior to the visit, additional usage of staff PPE, and extensive cleaning of exam room while observing appropriate contact time as indicated for disinfecting solutions.    HPI Pt presents for f/u of chronic health problems   Feeling a lot better from covid - she is still tired  She started with diarrhea and syncope  She got it from her grand child  Was previously walking 2 mi per day  Has to gradually get back to normal     Wt Readings from Last 3 Encounters:  11/24/19 139 lb (63 kg)  07/26/19 159 lb 6 oz (72.3 kg)  07/19/19 160 lb 9 oz (72.8 kg)  wt is down  Part of loss before covid and then 10 more lb after  24.62 kg/m   Here for f/u of new DM2 Lab Results  Component Value Date   HGBA1C 7.3 (H) 07/19/2019   Pt declined metformin and wanted to work on lifestyle change first  Was ref to DM teaching   Before she got covid was eating much much better  Protein and produce  Walking 2 mi per day  Avoided sweets entirely  Lots of water   Today -much improvement Lab Results  Component Value Date   HGBA1C 6.4 (A) 11/24/2019     Intol to atorvastatin in the past -but willing to try another low dose statin for vascular protection    Eye care -had eye exam in may   bp is stable today  No cp or palpitations or headaches or edema  No side effects to medicines  BP Readings from Last 3 Encounters:  11/24/19 124/76  10/20/19 128/70  10/13/19 135/70     Hyperlipidemia Lab Results  Component Value Date   CHOL 306 (H) 07/19/2019   HDL 45.70 07/19/2019   LDLDIRECT 151.0 07/19/2019   TRIG 97 10/13/2019   CHOLHDL 7 07/19/2019   Diet has been better overall-will expect improvement Also open to trying another statin 2 d per week   Patient  Active Problem List   Diagnosis Date Noted  . Type 2 diabetes mellitus without complication, without long-term current use of insulin ([place]) 07/26/2019  . Essential hypertension 11/23/2018  . Elevated glucose level 07/18/2018  . Routine general medical examination at a health care facility 07/07/2017  . Heme positive stool 08/08/2016  . Colon cancer screening 07/14/2016  . Family history of colon cancer 07/14/2016  . Pre-employment examination 08/26/2013  . Hemorrhoids 10/28/2011  . HEMORRHOIDS, INTERNAL 01/08/2011  . Hyperlipidemia associated with type 2 diabetes mellitus ([place]) 01/12/2008  . DEPRESSION 01/12/2008  . ROSACEA 01/12/2008   Past Medical History:  Diagnosis Date  . COVID-19   . Depression   . Hyperlipemia    borderline not on Rx.  . Osteoporosis   . Rosacea   . Skin cancer    on nose and neck 2 times/basal cell   Past Surgical History:  Procedure Laterality Date  . COLONOSCOPY    . HEMORRHOID SURGERY     Pt passed out past surgery due to pain!  Marland Kitchen PARTIAL HYSTERECTOMY     secondary to abn pap  . Patient refuses     mammograms, chol treatment and vaccines  . POLYPECTOMY     Social History  Tobacco Use  . Smoking status: Never Smoker  . Smokeless tobacco: Never Used  Substance Use Topics  . Alcohol use: No  . Drug use: No   Family History  Problem Relation Age of Onset  . Heart disease Mother        MI  . Cancer Sister 6       lung cancer  . Lung cancer Sister   . Colon cancer Brother 36       died at 14  . Heart disease Father        CHF  . Stomach cancer Maternal Aunt   . Colon polyps Neg Hx   . Esophageal cancer Neg Hx   . Rectal cancer Neg Hx   . Breast cancer Neg Hx    Allergies  Allergen Reactions  . Alendronate Sodium     REACTION: depressed, headaches, stomach aches  . Morphine     REACTION: /whelts  . Atorvastatin     REACTION: pt does not remember   Current Outpatient Medications on File Prior to Visit  Medication Sig  Dispense Refill  . amLODipine (NORVASC) 5 MG tablet Take 1 tablet (5 mg total) by mouth daily. 30 tablet 11  . Cholecalciferol (VITAMIN D3) 25 MCG (1000 UT) CAPS Take 1 capsule by mouth daily.    Marland Kitchen ELDERBERRY PO Take by mouth.    Marland Kitchen ibuprofen (ADVIL) 200 MG tablet Take 200 mg by mouth daily as needed for mild pain.    . Multiple Vitamin (MULTIVITAMIN WITH MINERALS) TABS tablet Take 1 tablet by mouth daily.    . NON FORMULARY Doterra on Guard-Take one daily    . Omega-3 Fatty Acids (FISH OIL) 1000 MG CAPS Take 1pill by mouth daily     No current facility-administered medications on file prior to visit.    Review of Systems  Constitutional: Positive for fatigue. Negative for activity change, appetite change, fever and unexpected weight change.  HENT: Negative for congestion, ear pain, rhinorrhea, sinus pressure and sore throat.   Eyes: Negative for pain, redness and visual disturbance.  Respiratory: Negative for cough, shortness of breath and wheezing.   Cardiovascular: Negative for chest pain and palpitations.  Gastrointestinal: Negative for abdominal pain, blood in stool, constipation and diarrhea.  Endocrine: Negative for polydipsia and polyuria.  Genitourinary: Negative for dysuria, frequency and urgency.  Musculoskeletal: Negative for arthralgias, back pain and myalgias.  Skin: Negative for pallor and rash.  Allergic/Immunologic: Negative for environmental allergies.  Neurological: Negative for dizziness, syncope and headaches.  Hematological: Negative for adenopathy. Does not bruise/bleed easily.  Psychiatric/Behavioral: Negative for decreased concentration and dysphoric mood. The patient is not nervous/anxious.        Objective:   Physical Exam Constitutional:      General: She is not in acute distress.    Appearance: Normal appearance. She is well-developed. She is not ill-appearing or diaphoretic.  HENT:     Head: Normocephalic and atraumatic.  Eyes:     General: No scleral  icterus.    Conjunctiva/sclera: Conjunctivae normal.     Pupils: Pupils are equal, round, and reactive to light.  Neck:     Thyroid: No thyromegaly.     Vascular: No carotid bruit or JVD.  Cardiovascular:     Rate and Rhythm: Normal rate and regular rhythm.     Heart sounds: Normal heart sounds. No gallop.   Pulmonary:     Effort: Pulmonary effort is normal. No respiratory distress.     Breath sounds:  Normal breath sounds. No wheezing or rales.  Abdominal:     General: Bowel sounds are normal. There is no distension or abdominal bruit.     Palpations: Abdomen is soft. There is no mass.     Tenderness: There is no abdominal tenderness.  Musculoskeletal:     Cervical back: Normal range of motion and neck supple.     Right lower leg: No edema.     Left lower leg: No edema.  Lymphadenopathy:     Cervical: No cervical adenopathy.  Skin:    General: Skin is warm and dry.     Findings: No rash.  Neurological:     Mental Status: She is alert.     Coordination: Coordination normal.     Deep Tendon Reflexes: Reflexes are normal and symmetric. Reflexes normal.  Psychiatric:        Mood and Affect: Mood normal.           Assessment & Plan:   Problem List Items Addressed This Visit      Cardiovascular and Mediastinum   Essential hypertension    bp in fair control at this time  BP Readings from Last 1 Encounters:  11/24/19 124/76   No changes needed Most recent labs reviewed  Disc lifstyle change with low sodium diet and exercise        Relevant Medications   rosuvastatin (CRESTOR) 5 MG tablet     Endocrine   Hyperlipidemia associated with type 2 diabetes mellitus ([place])    Disc goals for lipids and reasons to control them Rev last labs with pt Rev low sat fat diet in detail Pt has agreed to try low dose crestor 5 mg twice weekly  inst to stop medication and call if any side effects  Re check 3 mo       Relevant Medications   rosuvastatin (CRESTOR) 5 MG tablet    Type 2 diabetes mellitus without complication, without long-term current use of insulin ([facility]) - Primary    Lab Results  Component Value Date   HGBA1C 6.4 (A) 11/24/2019   Much improvement with diet/exercise and some wt loss (some of the wt loss was from covid however) microalb done today Statin started -crestor 5 mg twice weekly and can titrate up if well tolerated well  Re check A1C in 3 mo       Relevant Medications   rosuvastatin (CRESTOR) 5 MG tablet   Other Relevant Orders   HgB A1c (Completed)   Microalbumin / creatinine urine ratio (Completed)     Other   History of COVID-19    Diagnosed with syncope and diarrhea She has made a good recovery with some residual fatigue Wt loss noted  Enc gradual return to regular activities  Reassuring exam

## 2019-11-24 NOTE — Assessment & Plan Note (Signed)
Lab Results  Component Value Date   HGBA1C 6.4 (A) 11/24/2019   Much improvement with diet/exercise and some wt loss (some of the wt loss was from covid however) microalb done today Statin started -crestor 5 mg twice weekly and can titrate up if well tolerated well  Re check A1C in 3 mo

## 2019-11-24 NOTE — Assessment & Plan Note (Signed)
bp in fair control at this time  BP Readings from Last 1 Encounters:  11/24/19 124/76   No changes needed Most recent labs reviewed  Disc lifstyle change with low sodium diet and exercise

## 2019-11-24 NOTE — Patient Instructions (Addendum)
Keep eating healthy (low glycemic and low cholesterol)  Drink lots of water  Get gradually back to regular exercise when you feel up to it  A1C is much better at 6.4   Start crestor 5 mg -take 1 pill twice weekly  If any side effects or problems let us know   We will check a urine test for protein on the way out    We will recheck this in 3 months with next A1C

## 2019-11-24 NOTE — Assessment & Plan Note (Signed)
Diagnosed with syncope and diarrhea She has made a good recovery with some residual fatigue Wt loss noted  Enc gradual return to regular activities  Reassuring exam

## 2020-02-14 ENCOUNTER — Ambulatory Visit: Payer: Medicare Other | Admitting: Internal Medicine

## 2020-02-15 ENCOUNTER — Ambulatory Visit (INDEPENDENT_AMBULATORY_CARE_PROVIDER_SITE_OTHER): Payer: Medicare Other | Admitting: Family Medicine

## 2020-02-15 ENCOUNTER — Other Ambulatory Visit: Payer: Self-pay

## 2020-02-15 ENCOUNTER — Encounter: Payer: Self-pay | Admitting: Family Medicine

## 2020-02-15 VITALS — BP 118/60 | HR 66 | Temp 97.8°F | Wt 139.0 lb

## 2020-02-15 DIAGNOSIS — H6121 Impacted cerumen, right ear: Secondary | ICD-10-CM | POA: Diagnosis not present

## 2020-02-15 NOTE — Progress Notes (Signed)
   Subjective:    Patient ID: Connie Bell, female    DOB: January 20, 1941, 79 y.o.   MRN: [id number]  HPI  BP 118/60 (BP Location: Left Arm, Patient Position: Sitting, Cuff Size: Normal)   Pulse 66   Temp 97.8 F (36.6 C) (Tympanic)   Wt 139 lb (63 kg)   SpO2 96%   BMI 24.62 kg/m      Review of Systems  Constitutional: Negative.   HENT: Positive for hearing loss.        Feels like right ear is full of water  Eyes: Negative.   Respiratory: Negative.   Cardiovascular: Negative.   Gastrointestinal: Negative.   Genitourinary: Negative.   Musculoskeletal: Negative.   Skin: Negative.   Neurological: Negative.   Psychiatric/Behavioral: Negative.     Review of Systems  Constitutional: Negative.   HENT: Positive for hearing loss.        Feels like right ear is full of water  Eyes: Negative.   Respiratory: Negative.   Cardiovascular: Negative.   Gastrointestinal: Negative.   Genitourinary: Negative.   Musculoskeletal: Negative.   Skin: Negative.   Neurological: Negative.   Endo/Heme/Allergies: Negative.   Psychiatric/Behavioral: Negative.         Objective:   Physical Exam HENT:     Right Ear: Decreased hearing noted.     Ears:     Weber exam findings: does not lateralize.    Right Rinne: BC > AC.     Mouth/Throat:     Pharynx: Posterior oropharyngeal erythema present.  Eyes:     General: Lids are normal. Lids are everted, no foreign bodies appreciated.     Extraocular Movements: EOM normal.     Conjunctiva/sclera: Conjunctivae normal.     Pupils: Pupils are equal, round, and reactive to light.  Neck:     Trachea: Trachea normal.  Cardiovascular:     Rate and Rhythm: Normal rate and regular rhythm.     Pulses: Normal pulses.  Pulmonary:     Effort: Pulmonary effort is normal.     Breath sounds: Normal breath sounds.  Musculoskeletal:     Cervical back: Normal range of motion.  Neurological:     Mental Status: She is alert and oriented to person, place,  and time.  Psychiatric:        Mood and Affect: Mood and affect normal.        Judgment: Judgment normal.     Physical Exam  Constitutional: She is oriented to person, place, and time.  HENT:  Right Ear: Decreased hearing is noted.  Ears:  Mouth/Throat: Posterior oropharyngeal erythema present.  Eyes: Pupils are equal, round, and reactive to light. Conjunctivae, EOM and lids are normal. Lids are everted and swept, no foreign bodies found.  Neck: Trachea normal.  Cardiovascular: Normal rate, regular rhythm and normal pulses.  Pulmonary/Chest: Effort normal and breath sounds normal.  Musculoskeletal:     Cervical back: Normal range of motion.  Neurological: She is alert and oriented to person, place, and time.  Psychiatric: Mood, affect and judgment normal.          Assessment & Plan:  1. Impacted cerumen, right: ear irrigation with peroxide and warm water performed, a significant amount of cerumen was removed; patient feels that her hearing improved after the procedure. 2. Education on the correct methods of the ear canals cleaning was provided.

## 2020-02-15 NOTE — Progress Notes (Signed)
   Subjective:    Patient ID: NATALEIGH WYNES, female    DOB: 1941/09/22, 79 y.o.   MRN: [id number]  HPI Chief Complaint  Patient presents with  . Ear Pain    right & left ear pain, mainly right - went to [place] 2 weeks ago and got water in her ears   She denies any fever/ chills, nasal drainage, has some rhinorrhea with walking outside. No cough, wheeze or SOB. Ears feel stopped up, right worse than left.  She does use cotton swabs.    Review of Systems Per HPI    Objective:   Physical Exam Vitals reviewed.  Constitutional:      Appearance: Normal appearance. She is normal weight.  HENT:     Head: Normocephalic and atraumatic.     Right Ear: There is impacted cerumen.     Left Ear: Tympanic membrane, ear canal and external ear normal.     Ears:     Weber exam findings: does not lateralize.    Right Rinne: BC > AC.    Left Rinne: AC > BC. Neurological:     Mental Status: She is alert.     BP 118/60 (BP Location: Left Arm, Patient Position: Sitting, Cuff Size: Normal)   Pulse 66   Temp 97.8 F (36.6 C) (Tympanic)   Wt 139 lb (63 kg)   SpO2 96%   BMI 24.62 kg/m  Wt Readings from Last 3 Encounters:  02/15/20 139 lb (63 kg)  11/24/19 139 lb (63 kg)  07/26/19 159 lb 6 oz (72.3 kg)         Assessment & Plan:  1. Impacted cerumen of right ear -Ear it irrigated with room temperature water.  Patient tolerated well.  Large amount of cerumen removed from right ear.  Patient reports increased comfort and ability to hear. -Discouraged use of cotton swabs in ears -Provided information regarding weekly mineral oil on a cottonball for wax softening -Follow-up as needed  This visit occurred during the SARS-CoV-2 public health emergency.  Safety protocols were in place, including screening questions prior to the visit, additional usage of staff PPE, and extensive cleaning of exam room while observing appropriate contact time as indicated for disinfecting solutions.       Clarene Reamer, FNP-BC  [facility]  02/15/2020 2:26 PM

## 2020-02-15 NOTE — Patient Instructions (Signed)
Good to see you today  Avoid using cotton swabs in your ear  May use cotton ball soaked in mineral oil into ear 10-20 min per week if having recurrent ear wax accumulation. May use dilute hydrogen peroxide (equal parts this and water) and place a few drops into ear every 2 weeks to help break down wax buildup.

## 2020-02-18 ENCOUNTER — Encounter: Payer: Self-pay | Admitting: Family Medicine

## 2020-02-23 ENCOUNTER — Other Ambulatory Visit: Payer: Self-pay

## 2020-02-23 ENCOUNTER — Other Ambulatory Visit (INDEPENDENT_AMBULATORY_CARE_PROVIDER_SITE_OTHER): Payer: Medicare Other

## 2020-02-23 DIAGNOSIS — E785 Hyperlipidemia, unspecified: Secondary | ICD-10-CM

## 2020-02-23 DIAGNOSIS — E1169 Type 2 diabetes mellitus with other specified complication: Secondary | ICD-10-CM | POA: Diagnosis not present

## 2020-02-23 DIAGNOSIS — E119 Type 2 diabetes mellitus without complications: Secondary | ICD-10-CM

## 2020-02-23 LAB — LIPID PANEL
Cholesterol: 163 mg/dL (ref 0–200)
HDL: 53.2 mg/dL (ref >39.00)
LDL Cholesterol: 89 mg/dL (ref 0–99)
NonHDL: 109.5
Total CHOL/HDL Ratio: 3
Triglycerides: 103 mg/dL (ref 0.0–149.0)
VLDL: 20.6 mg/dL (ref 0.0–40.0)

## 2020-02-23 LAB — AST: AST: 12 U/L (ref 0–37)

## 2020-02-23 LAB — HEMOGLOBIN A1C: Hgb A1c MFr Bld: 5.8 % (ref 4.6–6.5)

## 2020-02-23 LAB — ALT: ALT: 16 U/L (ref 0–35)

## 2020-02-27 ENCOUNTER — Other Ambulatory Visit: Payer: Self-pay

## 2020-02-27 ENCOUNTER — Other Ambulatory Visit: Payer: Self-pay | Admitting: Family Medicine

## 2020-02-27 ENCOUNTER — Ambulatory Visit
Admission: RE | Admit: 2020-02-27 | Discharge: 2020-02-27 | Disposition: A | Discharge status: Home / Self Care | Payer: Medicare Other | Source: Ambulatory Visit | Attending: Family Medicine | Admitting: Family Medicine

## 2020-02-27 DIAGNOSIS — R921 Mammographic calcification found on diagnostic imaging of breast: Secondary | ICD-10-CM

## 2020-08-06 DIAGNOSIS — H5203 Hypermetropia, bilateral: Secondary | ICD-10-CM | POA: Diagnosis not present

## 2020-08-06 DIAGNOSIS — H2513 Age-related nuclear cataract, bilateral: Secondary | ICD-10-CM | POA: Diagnosis not present

## 2020-08-30 ENCOUNTER — Ambulatory Visit
Admission: RE | Admit: 2020-08-30 | Discharge: 2020-08-30 | Disposition: A | Discharge status: Home / Self Care | Payer: Medicare Other | Source: Ambulatory Visit | Attending: Family Medicine | Admitting: Family Medicine

## 2020-08-30 ENCOUNTER — Other Ambulatory Visit: Payer: Self-pay

## 2020-08-30 DIAGNOSIS — R921 Mammographic calcification found on diagnostic imaging of breast: Secondary | ICD-10-CM | POA: Diagnosis not present

## 2020-11-22 DIAGNOSIS — L821 Other seborrheic keratosis: Secondary | ICD-10-CM | POA: Diagnosis not present

## 2020-11-22 DIAGNOSIS — L905 Scar conditions and fibrosis of skin: Secondary | ICD-10-CM | POA: Diagnosis not present

## 2020-11-22 DIAGNOSIS — D485 Neoplasm of uncertain behavior of skin: Secondary | ICD-10-CM | POA: Diagnosis not present

## 2020-11-22 DIAGNOSIS — C44311 Basal cell carcinoma of skin of nose: Secondary | ICD-10-CM | POA: Diagnosis not present

## 2020-11-22 DIAGNOSIS — Z85828 Personal history of other malignant neoplasm of skin: Secondary | ICD-10-CM | POA: Diagnosis not present

## 2020-12-28 IMAGING — DX DG CHEST 1V PORT
1 series · 1 of 1 positions shown · non-contrast
Comparison: 01/10/2008

CLINICAL DATA: Cough.

EXAM:
PORTABLE CHEST 1 VIEW

[chest ap]
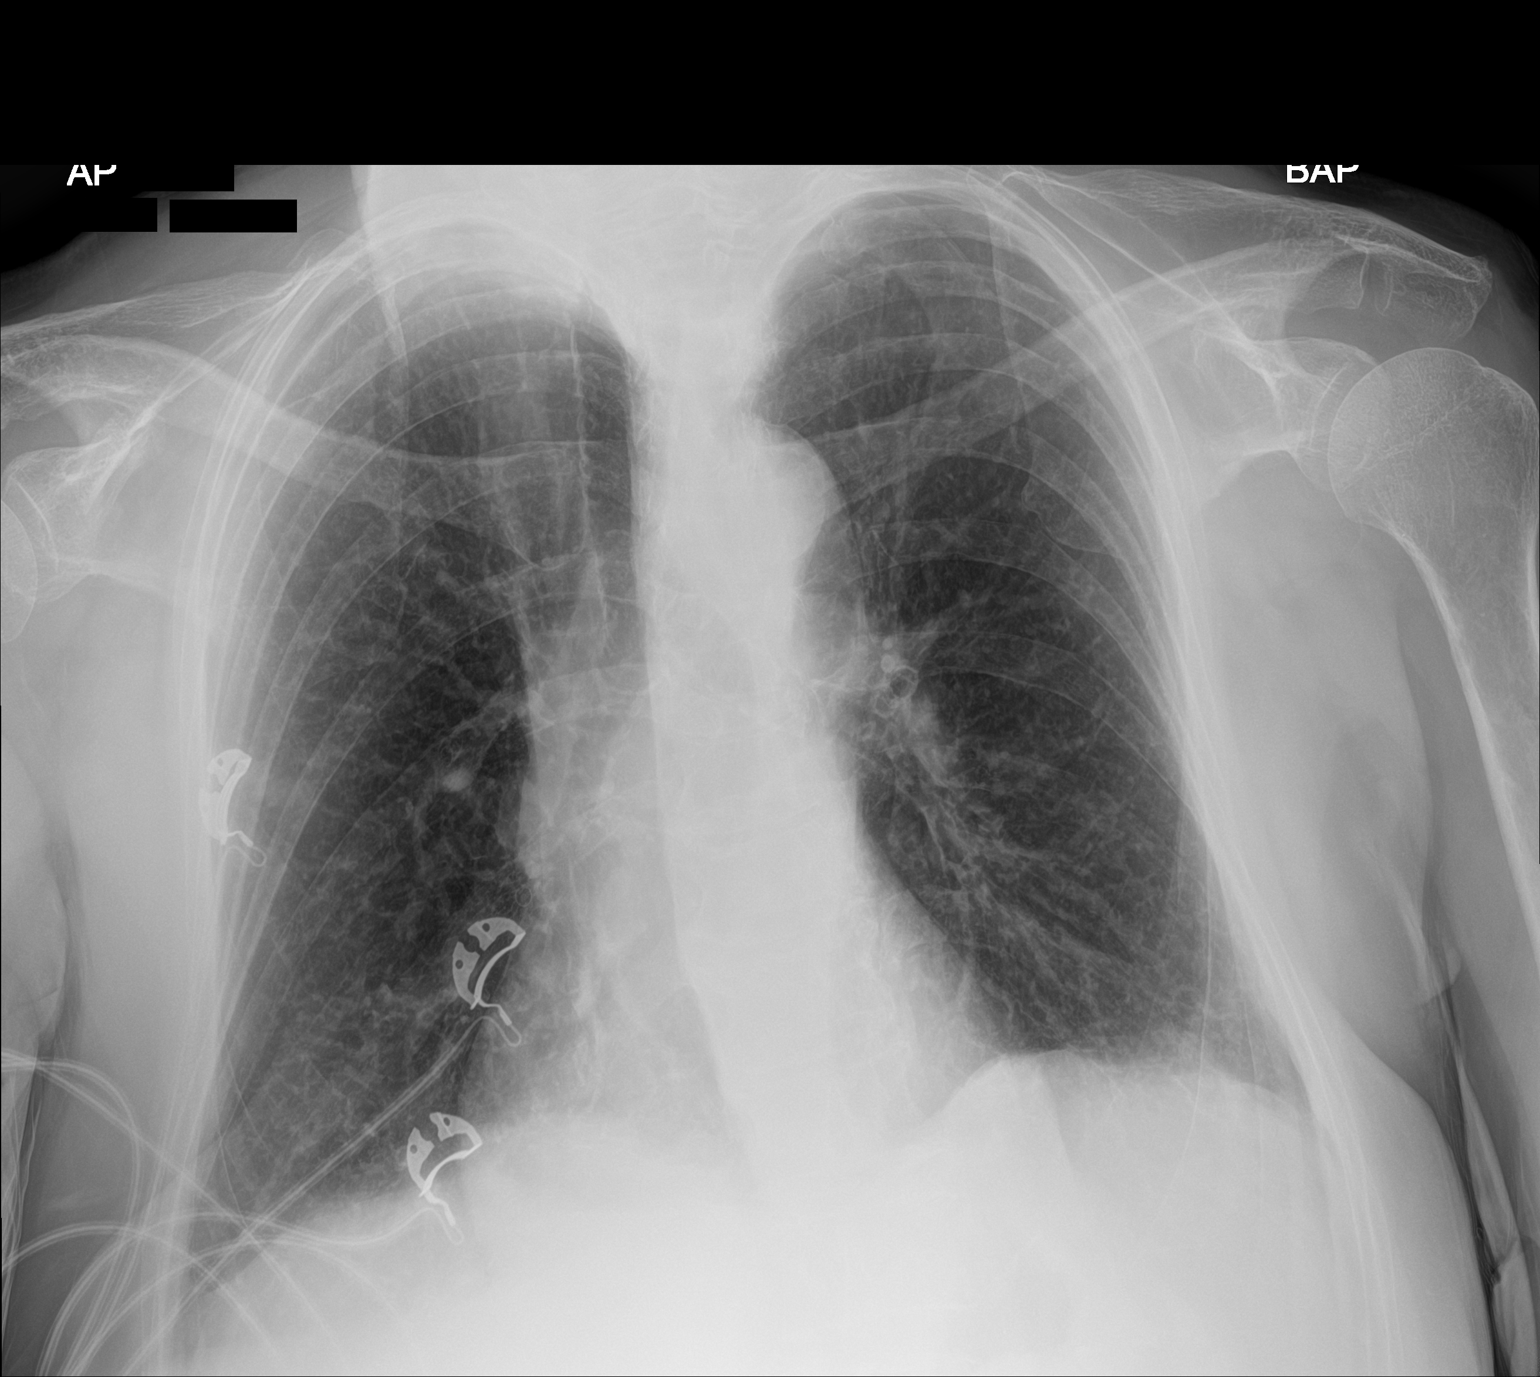

[1 of 1 positions shown; findings below may reference images not displayed]

FINDINGS: Cardiac silhouette is normal in size. No mediastinal or hilar
masses. No evidence of adenopathy.

Lungs are hyperexpanded with prominent bronchovascular markings. No
consolidation to suggest pneumonia. No evidence of pulmonary edema.
No pleural effusion or pneumothorax.

Skeletal structures are grossly intact.
IMPRESSION: No active disease.

## 2021-01-04 IMAGING — DX DG CHEST 1V PORT
1 series · 1 of 1 positions shown · non-contrast
Comparison: 10/13/2019

CLINICAL DATA: Cough and shortness of breath.

EXAM:
PORTABLE CHEST 1 VIEW

[chest ap]
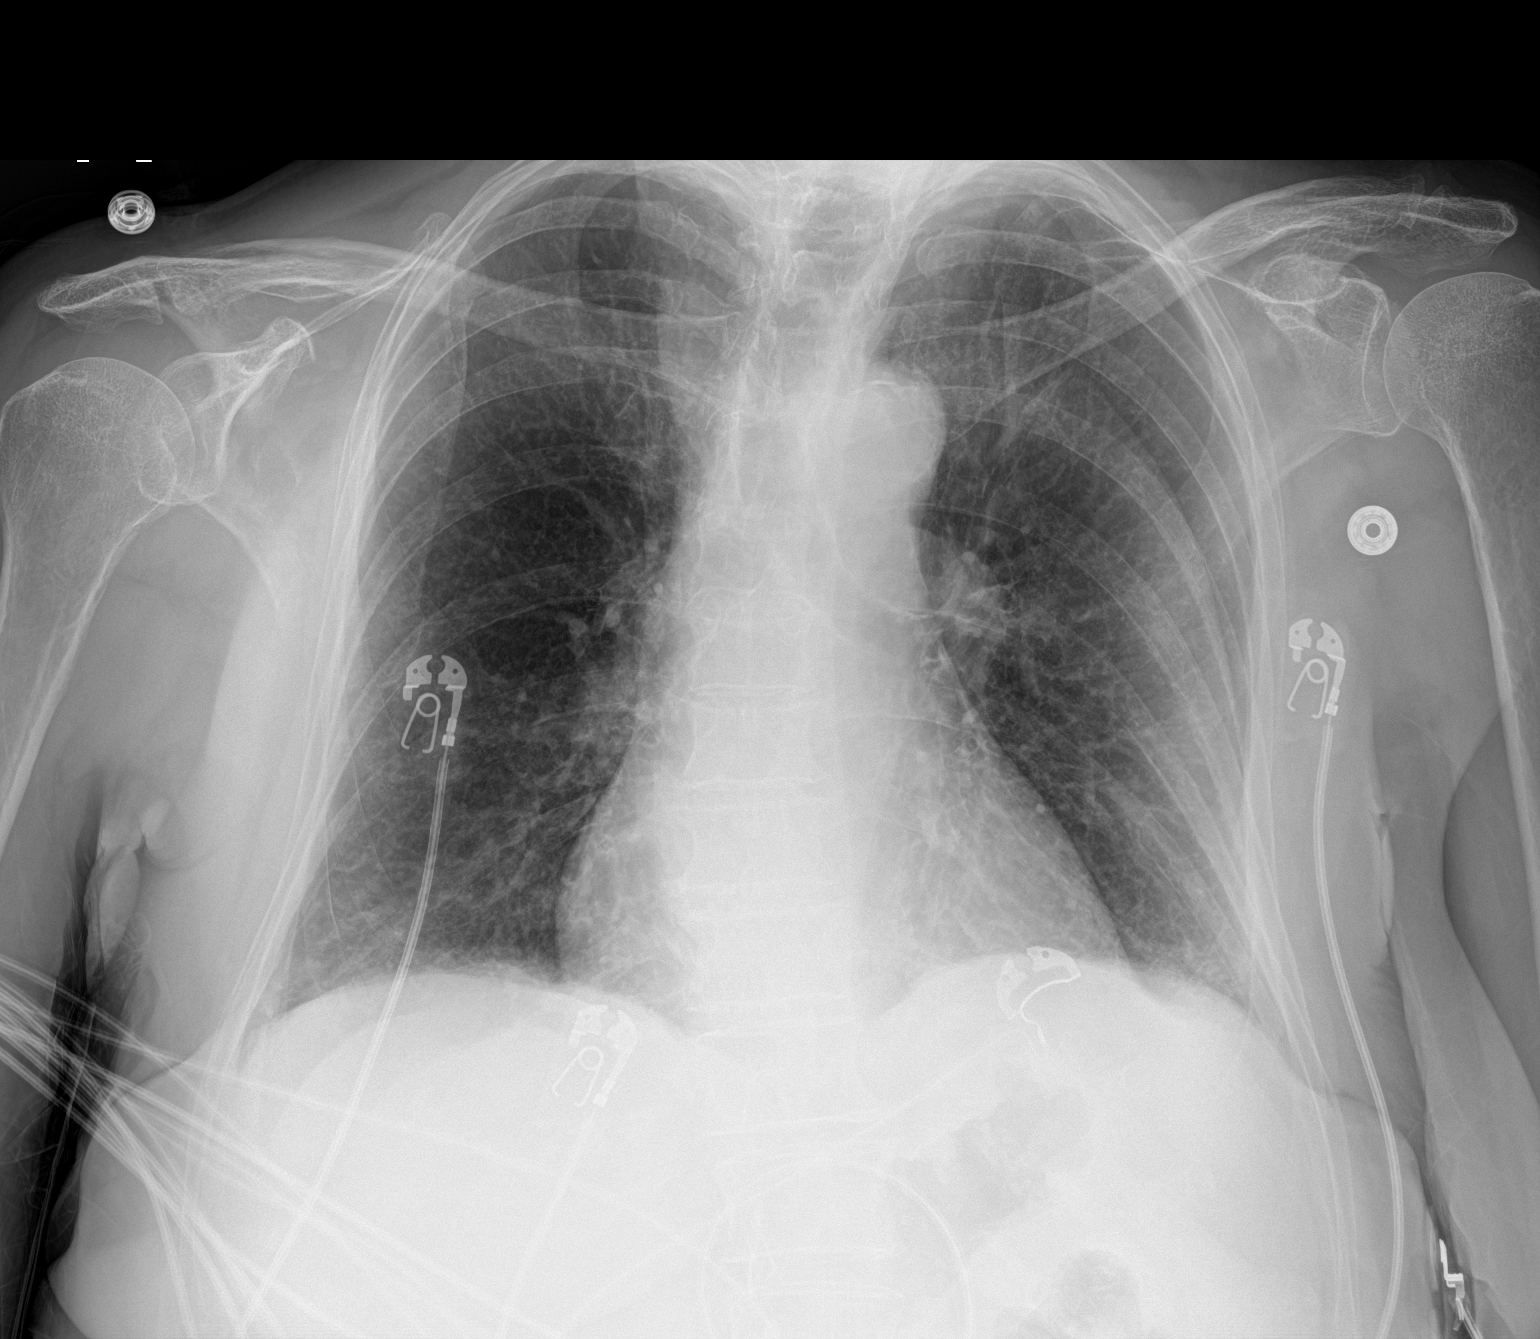

[1 of 1 positions shown; findings below may reference images not displayed]

FINDINGS: Cardiac silhouette is normal in size. No mediastinal or hilar
masses. No evidence of adenopathy.

Lungs show prominent bronchovascular markings and hyperexpansion,
otherwise clear. No evidence of pneumonia or pulmonary edema. No
pleural effusion or pneumothorax.

Skeletal structures grossly intact.
IMPRESSION: No acute cardiopulmonary disease.

## 2021-01-10 ENCOUNTER — Encounter: Payer: Self-pay | Admitting: Family Medicine

## 2021-01-10 DIAGNOSIS — C44311 Basal cell carcinoma of skin of nose: Secondary | ICD-10-CM | POA: Diagnosis not present

## 2021-01-25 ENCOUNTER — Encounter: Payer: Self-pay | Admitting: Gastroenterology

## 2021-02-07 DIAGNOSIS — H524 Presbyopia: Secondary | ICD-10-CM | POA: Diagnosis not present

## 2021-02-07 DIAGNOSIS — H2513 Age-related nuclear cataract, bilateral: Secondary | ICD-10-CM | POA: Diagnosis not present

## 2022-01-24 ENCOUNTER — Other Ambulatory Visit: Payer: Self-pay | Admitting: Family Medicine

## 2022-01-24 DIAGNOSIS — Z1231 Encounter for screening mammogram for malignant neoplasm of breast: Secondary | ICD-10-CM

## 2022-01-24 DIAGNOSIS — R921 Mammographic calcification found on diagnostic imaging of breast: Secondary | ICD-10-CM

## 2022-02-20 ENCOUNTER — Ambulatory Visit
Admission: RE | Admit: 2022-02-20 | Discharge: 2022-02-20 | Disposition: A | Discharge status: Home / Self Care | Payer: Medicare Other | Source: Ambulatory Visit | Attending: Family Medicine | Admitting: Family Medicine

## 2022-02-20 DIAGNOSIS — R921 Mammographic calcification found on diagnostic imaging of breast: Secondary | ICD-10-CM

## 2022-02-20 DIAGNOSIS — R922 Inconclusive mammogram: Secondary | ICD-10-CM | POA: Diagnosis not present

## 2022-02-20 DIAGNOSIS — Z1231 Encounter for screening mammogram for malignant neoplasm of breast: Secondary | ICD-10-CM

## 2022-03-05 ENCOUNTER — Ambulatory Visit: Payer: Medicare Other | Admitting: Podiatry

## 2022-03-05 ENCOUNTER — Ambulatory Visit (INDEPENDENT_AMBULATORY_CARE_PROVIDER_SITE_OTHER): Payer: Medicare Other

## 2022-03-05 ENCOUNTER — Encounter: Payer: Self-pay | Admitting: Podiatry

## 2022-03-05 DIAGNOSIS — M79672 Pain in left foot: Secondary | ICD-10-CM

## 2022-03-05 DIAGNOSIS — G8929 Other chronic pain: Secondary | ICD-10-CM

## 2022-03-05 DIAGNOSIS — M722 Plantar fascial fibromatosis: Secondary | ICD-10-CM | POA: Diagnosis not present

## 2022-03-05 MED ORDER — MELOXICAM 15 MG PO TABS: 15.0000 mg | ORAL_TABLET | Freq: Every day | ORAL | 0 refills | Status: DC

## 2022-03-05 NOTE — Patient Instructions (Signed)

## 2022-03-05 NOTE — Progress Notes (Signed)
?  Subjective:  ?Patient ID: Connie Bell, female    DOB: [birth year],   MRN: [id number] ? ?Chief Complaint  ?Patient presents with  ? Plantar Fasciitis  ?  Left heel pain was walking on some mulch on a trail and I have some burning and throbbing  ? ? ?81 y.o. female presents for concern of left heel pain that has been going on for about a month. Relates she has soreness and burning. Relates it hurts more after periods of sitting Relates she uses a pad that helped, but OTC medications have not helped and neither have creams.  Denies any other pedal complaints. Denies n/v/f/c.  ? ?Past Medical History:  ?Diagnosis Date  ? COVID-19   ? Depression   ? Hyperlipemia   ? borderline not on Rx.  ? Osteoporosis   ? Rosacea   ? Skin cancer   ? on nose and neck 2 times/basal cell  ? ? ?Objective:  ?Physical Exam: ?Vascular: DP/PT pulses 2/4 bilateral. CFT <3 seconds. Normal hair growth on digits. No edema.  ?Skin. No lacerations or abrasions bilateral feet.  ?Musculoskeletal: MMT 5/5 bilateral lower extremities in DF, PF, Inversion and Eversion. Deceased ROM in DF of ankle joint. Tender to medial calcaneal tubercle on the left. No pain to achilles tendon or PT tendon. No pain along arch or with calcaneal squeeze.  ?Neurological: Sensation intact to light touch.  ? ?Assessment:  ? ?1. Plantar fasciitis of left foot   ? ? ? ?Plan:  ?Patient was evaluated and treated and all questions answered. ?Discussed plantar fasciitis with patient.  ?X-rays reviewed and discussed with patient. No acute fractures or dislocations noted. Mild spurring noted at inferior calcaneus.  ?Discussed treatment options including, ice, NSAIDS, supportive shoes, bracing, and stretching. Stretching exercises provided to be done on a daily basis.  ?Pf brace dispensed.   ?Prescription for meloxicam provided and sent to pharmacy. ?Follow-up 6 weeks or sooner if any problems arise. In the meantime, encouraged to call the office with any questions, concerns,  change in symptoms.  ? ? ? ? ?Lorenda Peck, DPM  ? ? ?

## 2022-03-27 DIAGNOSIS — H524 Presbyopia: Secondary | ICD-10-CM | POA: Diagnosis not present

## 2022-03-27 DIAGNOSIS — H04123 Dry eye syndrome of bilateral lacrimal glands: Secondary | ICD-10-CM | POA: Diagnosis not present

## 2022-03-27 DIAGNOSIS — H2513 Age-related nuclear cataract, bilateral: Secondary | ICD-10-CM | POA: Diagnosis not present

## 2022-04-01 ENCOUNTER — Other Ambulatory Visit: Payer: Self-pay | Admitting: Podiatry

## 2022-04-16 ENCOUNTER — Encounter: Payer: Self-pay | Admitting: Podiatry

## 2022-04-16 ENCOUNTER — Ambulatory Visit: Payer: Medicare Other | Admitting: Podiatry

## 2022-04-16 DIAGNOSIS — M722 Plantar fascial fibromatosis: Secondary | ICD-10-CM | POA: Diagnosis not present

## 2022-04-16 NOTE — Progress Notes (Signed)
  Subjective:  Patient ID: Connie Bell, female    DOB: [birth year],   MRN: [id number]  Chief Complaint  Patient presents with   Foot Pain    Left foot pain 6 wk follow up    82 y.o. female presents for follow-up of left plantar fasciitis. Relates she is almost 100% better but not quite. Relates she has been stretching and wearing support and took the meloxicam. Swelling has improved. Wondering if she can do her 2 mile walks again.  Denies any other pedal complaints. Denies n/v/f/c.   Past Medical History:  Diagnosis Date   COVID-19    Depression    Hyperlipemia    borderline not on Rx.   Osteoporosis    Rosacea    Skin cancer    on nose and neck 2 times/basal cell    Objective:  Physical Exam: Vascular: DP/PT pulses 2/4 bilateral. CFT <3 seconds. Normal hair growth on digits. No edema.  Skin. No lacerations or abrasions bilateral feet.  Musculoskeletal: MMT 5/5 bilateral lower extremities in DF, PF, Inversion and Eversion. Deceased ROM in DF of ankle joint. Mildly tender to medial calcaneal tubercle on the left. No pain to achilles tendon or PT tendon. No pain along arch or with calcaneal squeeze.  Neurological: Sensation intact to light touch.   Assessment:   1. Plantar fasciitis of left foot      Plan:  Patient was evaluated and treated and all questions answered. Discussed plantar fasciitis with patient.  X-rays reviewed and discussed with patient. No acute fractures or dislocations noted. Mild spurring noted at inferior calcaneus.  Discussed treatment options including, ice, NSAIDS, supportive shoes, bracing, and stretching.  Continue stretching and supporitve shoes. Meloxicam as needed.  Follow-up as needed.      Lorenda Peck, DPM

## 2022-10-16 DIAGNOSIS — Z0100 Encounter for examination of eyes and vision without abnormal findings: Secondary | ICD-10-CM | POA: Diagnosis not present

## 2023-02-14 DIAGNOSIS — Z0110 Encounter for examination of ears and hearing without abnormal findings: Secondary | ICD-10-CM | POA: Diagnosis not present

## 2023-02-18 ENCOUNTER — Encounter: Payer: Self-pay | Admitting: Family Medicine

## 2023-02-18 ENCOUNTER — Ambulatory Visit (INDEPENDENT_AMBULATORY_CARE_PROVIDER_SITE_OTHER): Payer: Medicare Other | Admitting: Family Medicine

## 2023-02-18 VITALS — BP 132/80 | HR 70 | Temp 97.9°F | Ht 63.0 in | Wt 158.4 lb

## 2023-02-18 DIAGNOSIS — E785 Hyperlipidemia, unspecified: Secondary | ICD-10-CM

## 2023-02-18 DIAGNOSIS — H9193 Unspecified hearing loss, bilateral: Secondary | ICD-10-CM | POA: Diagnosis not present

## 2023-02-18 DIAGNOSIS — Z800 Family history of malignant neoplasm of digestive organs: Secondary | ICD-10-CM | POA: Diagnosis not present

## 2023-02-18 DIAGNOSIS — E1169 Type 2 diabetes mellitus with other specified complication: Secondary | ICD-10-CM | POA: Diagnosis not present

## 2023-02-18 DIAGNOSIS — I10 Essential (primary) hypertension: Secondary | ICD-10-CM | POA: Diagnosis not present

## 2023-02-18 DIAGNOSIS — E119 Type 2 diabetes mellitus without complications: Secondary | ICD-10-CM

## 2023-02-18 DIAGNOSIS — H9190 Unspecified hearing loss, unspecified ear: Secondary | ICD-10-CM | POA: Insufficient documentation

## 2023-02-18 NOTE — Assessment & Plan Note (Signed)
BP: 132/80  Declines treatment or further eval or labs at this time

## 2023-02-18 NOTE — Assessment & Plan Note (Signed)
Pt declines any further colon cancer screening

## 2023-02-18 NOTE — Assessment & Plan Note (Signed)
Pt is pursuing hearing aides from [employer] Was told she has cerumen On exam today little to no cerumen so irrigation not performed Suggested debrox use at home in the future if she ever develops cerumen problems

## 2023-02-18 NOTE — Progress Notes (Signed)
Subjective:    Patient ID: Connie Bell, female    DOB: 01-14-1941, 82 y.o.   MRN: [id number]  HPI Pt presents with c/o ear fullness  Wt Readings from Last 3 Encounters:  02/18/23 158 lb 6 oz (71.8 kg)  02/15/20 139 lb (63 kg)  11/24/19 139 lb (63 kg)   28.05 kg/m  Vitals:   02/18/23 1540  BP: 132/80  Pulse: 70  Temp: 97.9 F (36.6 C)  SpO2: 95%   Went for a hearing test  Found cerumen bilateral - worse in the R ear No pain  Hearing is affected   Not using any debrox right now  Has not been here for visit since 2021 Working since then  States she is doing well  She declines eval or treatment for her chronic med problems incl dm, HTN, cholesterol  Does not want to take any medicine   Also declines annual physical or health mt at this time   Would rather come in for problem based visits only   Patient Active Problem List   Diagnosis Date Noted   Hearing loss 02/18/2023   History of COVID-19 11/24/2019   Type 2 diabetes mellitus without complication, without long-term current use of insulin 07/26/2019   Essential hypertension 11/23/2018   Routine general medical examination at a health care facility 07/07/2017   Heme positive stool 08/08/2016   Colon cancer screening 07/14/2016   Family history of colon cancer 07/14/2016   Pre-employment examination 08/26/2013   Hemorrhoids 10/28/2011   HEMORRHOIDS, INTERNAL 01/08/2011   Hyperlipidemia associated with type 2 diabetes mellitus 01/12/2008   DEPRESSION 01/12/2008   ROSACEA 01/12/2008   Past Medical History:  Diagnosis Date   COVID-19    Depression    Hyperlipemia    borderline not on Rx.   Osteoporosis    Rosacea    Skin cancer    on nose and neck 2 times/basal cell   Past Surgical History:  Procedure Laterality Date   COLONOSCOPY     HEMORRHOID SURGERY     Pt passed out past surgery due to pain!   PARTIAL HYSTERECTOMY     secondary to abn pap   Patient refuses     mammograms, chol treatment  and vaccines   POLYPECTOMY     Social History   Tobacco Use   Smoking status: Never   Smokeless tobacco: Never  Vaping Use   Vaping Use: Never used  Substance Use Topics   Alcohol use: No   Drug use: No   Family History  Problem Relation Age of Onset   Heart disease Mother        MI   Cancer Sister 57       lung cancer   Lung cancer Sister    Colon cancer Brother 69       died at 36   Heart disease Father        CHF   Stomach cancer Maternal Aunt    Colon polyps Neg Hx    Esophageal cancer Neg Hx    Rectal cancer Neg Hx    Breast cancer Neg Hx    Allergies  Allergen Reactions   Alendronate Sodium     REACTION: depressed, headaches, stomach aches   Morphine     REACTION: /whelts   Atorvastatin     REACTION: pt does not remember   Current Outpatient Medications on File Prior to Visit  Medication Sig Dispense Refill   Cholecalciferol (VITAMIN D3) 25 MCG (  1000 UT) CAPS Take 1 capsule by mouth daily.     ELDERBERRY PO Take by mouth.     ibuprofen (ADVIL) 200 MG tablet Take 200 mg by mouth daily as needed for mild pain.     Multiple Vitamin (MULTIVITAMIN WITH MINERALS) TABS tablet Take 1 tablet by mouth daily.     NON FORMULARY Doterra on Guard-Take one daily     Omega-3 Fatty Acids (FISH OIL) 1000 MG CAPS Take 1pill by mouth daily     No current facility-administered medications on file prior to visit.     Review of Systems  Constitutional:  Negative for activity change, appetite change, fatigue, fever and unexpected weight change.  HENT:  Positive for hearing loss. Negative for congestion, ear discharge, ear pain, rhinorrhea, sinus pressure and sore throat.   Eyes:  Negative for pain, redness and visual disturbance.  Respiratory:  Negative for cough, shortness of breath and wheezing.   Cardiovascular:  Negative for chest pain and palpitations.  Gastrointestinal:  Negative for abdominal pain, blood in stool, constipation and diarrhea.  Endocrine: Negative for  polydipsia and polyuria.  Genitourinary:  Negative for dysuria, frequency and urgency.  Musculoskeletal:  Negative for arthralgias, back pain and myalgias.  Skin:  Negative for pallor and rash.  Allergic/Immunologic: Negative for environmental allergies.  Neurological:  Negative for dizziness, syncope and headaches.  Hematological:  Negative for adenopathy. Does not bruise/bleed easily.  Psychiatric/Behavioral:  Negative for decreased concentration and dysphoric mood. The patient is not nervous/anxious.        Objective:   Physical Exam Constitutional:      General: She is not in acute distress.    Appearance: Normal appearance. She is normal weight. She is not ill-appearing or diaphoretic.  HENT:     Head: Normocephalic and atraumatic.     Right Ear: Tympanic membrane and ear canal normal.     Left Ear: Tympanic membrane and ear canal normal.     Ears:     Comments: Scant flake of cerumen bilaterally  Not obstructive  Not enough to impact hearing or use of hearing aides  Eyes:     Conjunctiva/sclera: Conjunctivae normal.     Pupils: Pupils are equal, round, and reactive to light.  Cardiovascular:     Rate and Rhythm: Normal rate and regular rhythm.     Heart sounds: Normal heart sounds.  Pulmonary:     Effort: Pulmonary effort is normal. No respiratory distress.     Breath sounds: Normal breath sounds. No wheezing or rales.  Skin:    Coloration: Skin is not pale.  Neurological:     Mental Status: She is alert.     Cranial Nerves: No cranial nerve deficit.  Psychiatric:        Mood and Affect: Mood normal.           Assessment & Plan:   Problem List Items Addressed This Visit       Cardiovascular and Mediastinum   Essential hypertension    BP: 132/80  Declines treatment or further eval or labs at this time        Endocrine   Hyperlipidemia associated with type 2 diabetes mellitus    Off medication  Declines further eval or treatment of this problem       Type 2 diabetes mellitus without complication, without long-term current use of insulin    No medications Pt declines further eval or tx of this problem  Nervous and Auditory   Hearing loss - Primary    Pt is pursuing hearing aides from [employer] Was told she has cerumen On exam today little to no cerumen so irrigation not performed Suggested debrox use at home in the future if she ever develops cerumen problems           Other   Family history of colon cancer    Pt declines any further colon cancer screening

## 2023-02-18 NOTE — Assessment & Plan Note (Signed)
No medications Pt declines further eval or tx of this problem

## 2023-02-18 NOTE — Patient Instructions (Signed)
I don't see enough ear wax to flush today  Should not affect hearing   In the future you can use debrox drops to soften ear wax  In the future if you want to follow up for diabetes / cholesterol or other chronic health problems let us know  If you want to discuss preventative medicine / cancer screening or immunizations- then make an appt for an annual exam

## 2023-02-18 NOTE — Assessment & Plan Note (Signed)
Off medication  Declines further eval or treatment of this problem

## 2023-04-20 ENCOUNTER — Other Ambulatory Visit: Payer: Self-pay

## 2023-04-20 ENCOUNTER — Encounter (HOSPITAL_COMMUNITY): Payer: Self-pay

## 2023-04-20 ENCOUNTER — Observation Stay (HOSPITAL_COMMUNITY)
Admission: EM | Admit: 2023-04-20 | Discharge: 2023-04-21 | Disposition: A | Discharge status: Home / Self Care | Payer: Medicare Other | Attending: Internal Medicine | Admitting: Internal Medicine

## 2023-04-20 ENCOUNTER — Observation Stay (HOSPITAL_COMMUNITY): Payer: Medicare Other

## 2023-04-20 ENCOUNTER — Emergency Department (HOSPITAL_COMMUNITY): Payer: Medicare Other

## 2023-04-20 DIAGNOSIS — G459 Transient cerebral ischemic attack, unspecified: Principal | ICD-10-CM | POA: Insufficient documentation

## 2023-04-20 DIAGNOSIS — R531 Weakness: Secondary | ICD-10-CM | POA: Diagnosis present

## 2023-04-20 DIAGNOSIS — R2990 Unspecified symptoms and signs involving the nervous system: Secondary | ICD-10-CM

## 2023-04-20 DIAGNOSIS — I6621 Occlusion and stenosis of right posterior cerebral artery: Secondary | ICD-10-CM | POA: Diagnosis not present

## 2023-04-20 DIAGNOSIS — E119 Type 2 diabetes mellitus without complications: Secondary | ICD-10-CM | POA: Diagnosis not present

## 2023-04-20 DIAGNOSIS — I639 Cerebral infarction, unspecified: Secondary | ICD-10-CM | POA: Diagnosis present

## 2023-04-20 DIAGNOSIS — Z85828 Personal history of other malignant neoplasm of skin: Secondary | ICD-10-CM | POA: Insufficient documentation

## 2023-04-20 DIAGNOSIS — R42 Dizziness and giddiness: Secondary | ICD-10-CM | POA: Insufficient documentation

## 2023-04-20 DIAGNOSIS — I10 Essential (primary) hypertension: Secondary | ICD-10-CM | POA: Insufficient documentation

## 2023-04-20 DIAGNOSIS — I4892 Unspecified atrial flutter: Secondary | ICD-10-CM | POA: Diagnosis not present

## 2023-04-20 DIAGNOSIS — E785 Hyperlipidemia, unspecified: Secondary | ICD-10-CM | POA: Diagnosis not present

## 2023-04-20 DIAGNOSIS — Z8616 Personal history of COVID-19: Secondary | ICD-10-CM | POA: Diagnosis not present

## 2023-04-20 DIAGNOSIS — Z8673 Personal history of transient ischemic attack (TIA), and cerebral infarction without residual deficits: Secondary | ICD-10-CM | POA: Diagnosis present

## 2023-04-20 DIAGNOSIS — F32A Depression, unspecified: Secondary | ICD-10-CM | POA: Insufficient documentation

## 2023-04-20 DIAGNOSIS — R29818 Other symptoms and signs involving the nervous system: Secondary | ICD-10-CM | POA: Diagnosis not present

## 2023-04-20 LAB — DIFFERENTIAL
Abs Immature Granulocytes: 0.02 10*3/uL (ref 0.00–0.07)
Basophils Absolute: 0.1 10*3/uL (ref 0.0–0.1)
Basophils Relative: 1 %
Eosinophils Absolute: 0.2 10*3/uL (ref 0.0–0.5)
Eosinophils Relative: 3 %
Immature Granulocytes: 0 %
Lymphocytes Relative: 37 %
Lymphs Abs: 2 10*3/uL (ref 0.7–4.0)
Monocytes Absolute: 0.4 10*3/uL (ref 0.1–1.0)
Monocytes Relative: 7 %
Neutro Abs: 2.8 10*3/uL (ref 1.7–7.7)
Neutrophils Relative %: 52 %

## 2023-04-20 LAB — APTT: aPTT: 25 seconds (ref 24–36)

## 2023-04-20 LAB — COMPREHENSIVE METABOLIC PANEL
ALT: 20 U/L (ref 0–44)
AST: 17 U/L (ref 15–41)
Albumin: 4 g/dL (ref 3.5–5.0)
Alkaline Phosphatase: 75 U/L (ref 38–126)
Anion gap: 9 (ref 5–15)
BUN: 10 mg/dL (ref 8–23)
CO2: 25 mmol/L (ref 22–32)
Calcium: 9.3 mg/dL (ref 8.9–10.3)
Chloride: 105 mmol/L (ref 98–111)
Creatinine, Ser: 0.79 mg/dL (ref 0.44–1.00)
GFR, Estimated: >60 mL/min (ref >60)
Glucose, Bld: 95 mg/dL (ref 70–99)
Potassium: 3.7 mmol/L (ref 3.5–5.1)
Sodium: 139 mmol/L (ref 135–145)
Total Bilirubin: 0.8 mg/dL (ref 0.3–1.2)
Total Protein: 7.7 g/dL (ref 6.5–8.1)

## 2023-04-20 LAB — URINALYSIS, ROUTINE W REFLEX MICROSCOPIC
Bilirubin Urine: NEGATIVE
Glucose, UA: NEGATIVE mg/dL
Hgb urine dipstick: NEGATIVE
Ketones, ur: NEGATIVE mg/dL
Nitrite: NEGATIVE
Protein, ur: NEGATIVE mg/dL
Specific Gravity, Urine: 1.003 — ABNORMAL LOW (ref 1.005–1.030)
pH: 7 (ref 5.0–8.0)

## 2023-04-20 LAB — I-STAT CHEM 8, ED
BUN: 11 mg/dL (ref 8–23)
Calcium, Ion: 1.19 mmol/L (ref 1.15–1.40)
Chloride: 104 mmol/L (ref 98–111)
Creatinine, Ser: 0.8 mg/dL (ref 0.44–1.00)
Glucose, Bld: 89 mg/dL (ref 70–99)
HCT: 40 % (ref 36.0–46.0)
Hemoglobin: 13.6 g/dL (ref 12.0–15.0)
Potassium: 3.7 mmol/L (ref 3.5–5.1)
Sodium: 141 mmol/L (ref 135–145)
TCO2: 28 mmol/L (ref 22–32)

## 2023-04-20 LAB — RAPID URINE DRUG SCREEN, HOSP PERFORMED
Amphetamines: NOT DETECTED
Barbiturates: NOT DETECTED
Benzodiazepines: NOT DETECTED
Cocaine: NOT DETECTED
Opiates: NOT DETECTED
Tetrahydrocannabinol: NOT DETECTED

## 2023-04-20 LAB — CBC
HCT: 40.4 % (ref 36.0–46.0)
Hemoglobin: 13.3 g/dL (ref 12.0–15.0)
MCH: 30.9 pg (ref 26.0–34.0)
MCHC: 32.9 g/dL (ref 30.0–36.0)
MCV: 93.7 fL (ref 80.0–100.0)
Platelets: 347 10*3/uL (ref 150–400)
RBC: 4.31 MIL/uL (ref 3.87–5.11)
RDW: 13.9 % (ref 11.5–15.5)
WBC: 5.4 10*3/uL (ref 4.0–10.5)
nRBC: 0 % (ref 0.0–0.2)

## 2023-04-20 LAB — PROTIME-INR
INR: 1 (ref 0.8–1.2)
Prothrombin Time: 13.7 seconds (ref 11.4–15.2)

## 2023-04-20 LAB — ETHANOL: Alcohol, Ethyl (B): <10 mg/dL (ref <10)

## 2023-04-20 LAB — TSH: TSH: 3.045 u[IU]/mL (ref 0.350–4.500)

## 2023-04-20 MED ORDER — SODIUM CHLORIDE 0.9 % IV SOLN: INTRAVENOUS | Status: DC

## 2023-04-20 MED ORDER — IOHEXOL 350 MG/ML SOLN
75.0000 mL | Freq: Once | INTRAVENOUS | Status: AC | PRN
  Administered 2023-04-20: 75 mL via INTRAVENOUS

## 2023-04-20 MED ORDER — ASPIRIN 81 MG PO CHEW
81.0000 mg | CHEWABLE_TABLET | Freq: Every day | ORAL | Status: DC
  Administered 2023-04-20 – 2023-04-21 (×2): 81 mg via ORAL
  Filled 2023-04-20 (×2): qty 1

## 2023-04-20 MED ORDER — STROKE: EARLY STAGES OF RECOVERY BOOK
Freq: Once | Status: AC
  Filled 2023-04-20: qty 1

## 2023-04-20 MED ORDER — CLOPIDOGREL BISULFATE 75 MG PO TABS
75.0000 mg | ORAL_TABLET | Freq: Every day | ORAL | Status: DC
  Administered 2023-04-20 – 2023-04-21 (×2): 75 mg via ORAL
  Filled 2023-04-20 (×2): qty 1

## 2023-04-20 MED ORDER — ENOXAPARIN SODIUM 40 MG/0.4ML IJ SOSY
40.0000 mg | PREFILLED_SYRINGE | INTRAMUSCULAR | Status: DC
  Administered 2023-04-20: 40 mg via SUBCUTANEOUS
  Filled 2023-04-20: qty 0.4

## 2023-04-20 MED ORDER — SENNOSIDES-DOCUSATE SODIUM 8.6-50 MG PO TABS: 1.0000 | ORAL_TABLET | Freq: Every evening | ORAL | Status: DC | PRN

## 2023-04-20 MED ORDER — ACETAMINOPHEN 650 MG RE SUPP: 650.0000 mg | RECTAL | Status: DC | PRN

## 2023-04-20 MED ORDER — ACETAMINOPHEN 325 MG PO TABS: 650.0000 mg | ORAL_TABLET | ORAL | Status: DC | PRN

## 2023-04-20 MED ORDER — ACETAMINOPHEN 160 MG/5ML PO SOLN: 650.0000 mg | ORAL | Status: DC | PRN

## 2023-04-20 NOTE — ED Notes (Signed)
ED TO INPATIENT HANDOFF REPORT  ED Nurse Name and Phone #: Dot Lanes, paramedic  S Name/Age/Gender Connie Bell 82 y.o. female Room/Bed: 034C/034C  Code Status   Code Status: Full Code  Home/SNF/Other Home Patient oriented to: self, place, time, and situation Is this baseline? Yes   Triage Complete: Triage complete  Chief Complaint TIA (transient ischemic attack) [G45.9]  Triage Note Pt arrives with c/o left sided weakness and facial droop. Per pt, she noticed symptoms when she woke up at 7am this morning. Per pt, she developed a headache around 9pm last night and when she went to bed at 23:30 last night she was normal. Pt denies CP or SOB. Pt endorses back pain. Pt denies sensation deficit or vision changes. Pt a&ox4.    Allergies Allergies  Allergen Reactions   Alendronate Sodium     REACTION: depressed, headaches, stomach aches   Morphine     REACTION: /whelts   Atorvastatin     REACTION: pt does not remember    Level of Care/Admitting Diagnosis ED Disposition     ED Disposition  Admit   Condition  --   Comment  Hospital Area: [facility] [[id number]]  Level of Care: Telemetry Medical [104]  May place patient in observation at [facility] or [facility] if equivalent level of care is available:: No  Covid Evaluation: Asymptomatic - no recent exposure (last 10 days) testing not required  Diagnosis: TIA (transient ischemic attack) [161096]  Admitting Physician: Joycelyn Das [[id number]]  Attending Physician: Joycelyn Das [[id number]]          B Medical/Surgery History Past Medical History:  Diagnosis Date   COVID-19    Depression    Hyperlipemia    borderline not on Rx.   Osteoporosis    Rosacea    Skin cancer    on nose and neck 2 times/basal cell   Past Surgical History:  Procedure Laterality Date   COLONOSCOPY     HEMORRHOID SURGERY     Pt passed out past surgery due to pain!   PARTIAL HYSTERECTOMY     secondary to abn pap    Patient refuses     mammograms, chol treatment and vaccines   POLYPECTOMY       A IV Location/Drains/Wounds Patient Lines/Drains/Airways Status     Active Line/Drains/Airways     Name Placement date Placement time Site Days   Peripheral IV 10/20/19 Right Forearm 10/20/19  0904  Forearm  1278            Intake/Output Last 24 hours No intake or output data in the 24 hours ending 04/20/23 1709  Labs/Imaging Results for orders placed or performed during the hospital encounter of 04/20/23 (from the past 48 hour(s))  Urine rapid drug screen (hosp performed)     Status: None   Collection Time: 04/20/23  3:15 PM  Result Value Ref Range   Opiates NONE DETECTED NONE DETECTED   Cocaine NONE DETECTED NONE DETECTED   Benzodiazepines NONE DETECTED NONE DETECTED   Amphetamines NONE DETECTED NONE DETECTED   Tetrahydrocannabinol NONE DETECTED NONE DETECTED   Barbiturates NONE DETECTED NONE DETECTED    Comment: (NOTE) DRUG SCREEN FOR MEDICAL PURPOSES ONLY.  IF CONFIRMATION IS NEEDED FOR ANY PURPOSE, NOTIFY LAB WITHIN 5 DAYS.  LOWEST DETECTABLE LIMITS FOR URINE DRUG SCREEN Drug Class                     Cutoff (ng/mL) Amphetamine and metabolites  1000 Barbiturate and metabolites    200 Benzodiazepine                 200 Opiates and metabolites        300 Cocaine and metabolites        300 THC                            50 Performed at [facility], [address], [place], [place] [address]   Urinalysis, Routine w reflex microscopic -Urine, Clean Catch     Status: Abnormal   Collection Time: 04/20/23  3:15 PM  Result Value Ref Range   Color, Urine STRAW (A) YELLOW   APPearance CLEAR CLEAR   Specific Gravity, Urine 1.003 (L) 1.005 - 1.030   pH 7.0 5.0 - 8.0   Glucose, UA NEGATIVE NEGATIVE mg/dL   Hgb urine dipstick NEGATIVE NEGATIVE   Bilirubin Urine NEGATIVE NEGATIVE   Ketones, ur NEGATIVE NEGATIVE mg/dL   Protein, ur NEGATIVE NEGATIVE mg/dL   Nitrite NEGATIVE  NEGATIVE   Leukocytes,Ua SMALL (A) NEGATIVE   RBC / HPF 0-5 0 - 5 RBC/hpf   WBC, UA 6-10 0 - 5 WBC/hpf   Bacteria, UA RARE (A) NONE SEEN   Squamous Epithelial / HPF 0-5 0 - 5 /HPF    Comment: Performed at [facility], [address], [place], [place] [address]  Protime-INR     Status: None   Collection Time: 04/20/23  3:48 PM  Result Value Ref Range   Prothrombin Time 13.7 11.4 - 15.2 seconds   INR 1.0 0.8 - 1.2    Comment: (NOTE) INR goal varies based on device and disease states. Performed at [facility], [address], [place], [place] [address]   APTT     Status: None   Collection Time: 04/20/23  3:48 PM  Result Value Ref Range   aPTT 25 24 - 36 seconds    Comment: Performed at [facility] Lab, [address], [place], [place] [address]  CBC     Status: None   Collection Time: 04/20/23  3:48 PM  Result Value Ref Range   WBC 5.4 4.0 - 10.5 K/uL   RBC 4.31 3.87 - 5.11 MIL/uL   Hemoglobin 13.3 12.0 - 15.0 g/dL   HCT 29.5 62.1 - 30.8 %   MCV 93.7 80.0 - 100.0 fL   MCH 30.9 26.0 - 34.0 pg   MCHC 32.9 30.0 - 36.0 g/dL   RDW 65.7 84.6 - 96.2 %   Platelets 347 150 - 400 K/uL   nRBC 0.0 0.0 - 0.2 %    Comment: Performed at [facility] Lab, [address], [place], [place] [address]  Differential     Status: None   Collection Time: 04/20/23  3:48 PM  Result Value Ref Range   Neutrophils Relative % 52 %   Neutro Abs 2.8 1.7 - 7.7 K/uL   Lymphocytes Relative 37 %   Lymphs Abs 2.0 0.7 - 4.0 K/uL   Monocytes Relative 7 %   Monocytes Absolute 0.4 0.1 - 1.0 K/uL   Eosinophils Relative 3 %   Eosinophils Absolute 0.2 0.0 - 0.5 K/uL   Basophils Relative 1 %   Basophils Absolute 0.1 0.0 - 0.1 K/uL   Immature Granulocytes 0 %   Abs Immature Granulocytes 0.02 0.00 - 0.07 K/uL    Comment: Performed at [facility]  Lab, [address] [place], [place] [address]  Comprehensive metabolic panel     Status: None   Collection Time: 04/20/23  3:48 PM  Result Value  Ref Range   Sodium 139 135 - 145 mmol/L   Potassium 3.7 3.5 - 5.1 mmol/L   Chloride 105 98 - 111 mmol/L   CO2 25 22 - 32 mmol/L   Glucose, Bld 95 70 - 99 mg/dL    Comment: Glucose reference range applies only to samples taken after fasting for at least 8 hours.   BUN 10 8 - 23 mg/dL   Creatinine, Ser 6.04 0.44 - 1.00 mg/dL   Calcium 9.3 8.9 - 54.0 mg/dL   Total Protein 7.7 6.5 - 8.1 g/dL   Albumin 4.0 3.5 - 5.0 g/dL   AST 17 15 - 41 U/L   ALT 20 0 - 44 U/L   Alkaline Phosphatase 75 38 - 126 U/L   Total Bilirubin 0.8 0.3 - 1.2 mg/dL   GFR, Estimated >98 >11 mL/min    Comment: (NOTE) Calculated using the CKD-EPI Creatinine Equation (2021)    Anion gap 9 5 - 15    Comment: Performed at [facility] Lab, [address] [place], [place] [address]  Ethanol     Status: None   Collection Time: 04/20/23  3:48 PM  Result Value Ref Range   Alcohol, Ethyl (B) <10 <10 mg/dL    Comment: (NOTE) Lowest detectable limit for serum alcohol is 10 mg/dL.  For medical purposes only. Performed at [facility] Lab, [address] [place], [place] [address]   I-stat chem 8, ED     Status: None   Collection Time: 04/20/23  3:54 PM  Result Value Ref Range   Sodium 141 135 - 145 mmol/L   Potassium 3.7 3.5 - 5.1 mmol/L   Chloride 104 98 - 111 mmol/L   BUN 11 8 - 23 mg/dL   Creatinine, Ser 1.30 0.44 - 1.00 mg/dL   Glucose, Bld 89 70 - 99 mg/dL    Comment: Glucose reference range applies only to samples taken after fasting for at least 8 hours.   Calcium, Ion 1.19 1.15 - 1.40 mmol/L   TCO2 28 22 - 32 mmol/L   Hemoglobin 13.6 12.0 - 15.0 g/dL   HCT 86.5 78.4 - 69.6 %   CT HEAD WO CONTRAST  Result Date: 04/20/2023 CLINICAL DATA:  Neuro deficit, acute, stroke suspected. EXAM: CT HEAD WITHOUT CONTRAST TECHNIQUE: Contiguous axial images were obtained from the base of the skull through the vertex without intravenous contrast. RADIATION DOSE REDUCTION: This exam was performed according to the  departmental dose-optimization program which includes automated exposure control, adjustment of the mA and/or kV according to patient size and/or use of iterative reconstruction technique. COMPARISON:  Head CT 08/20/2014.  MRI brain 08/25/2014. FINDINGS: Brain: No acute intracranial hemorrhage. Unchanged mild chronic small-vessel disease. Gray-white differentiation is otherwise preserved. No hydrocephalus or extra-axial collection. No mass effect or midline shift. Vascular: No hyperdense vessel or unexpected calcification. Skull: No calvarial fracture or suspicious bone lesion. Skull base is unremarkable. Sinuses/Orbits: Unremarkable. Other: None. IMPRESSION: 1. No acute intracranial abnormality. 2. Unchanged mild chronic small-vessel disease. Electronically Signed   By: Orvan Falconer M.D.   On: 04/20/2023 16:10    Pending Labs [employer] (From admission, onward)     Start     Ordered   Signed and Held  Lipid panel  (Labs)  Tomorrow morning,   R  Comments: Fasting    Signed and Held   Signed and Held  Hemoglobin A1c  (Labs)  Add-on,   R       Comments: To assess prior glycemic control    Signed and Held   Signed and Held  CBC  (enoxaparin (LOVENOX)    CrCl >/= 30 ml/min)  Once,   R       Comments: Baseline for enoxaparin therapy IF NOT ALREADY DRAWN.  Notify MD if PLT < 100 K.    Signed and Held   Signed and Held  Creatinine, serum  (enoxaparin (LOVENOX)    CrCl >/= 30 ml/min)  Once,   R       Comments: Baseline for enoxaparin therapy IF NOT ALREADY DRAWN.    Signed and Held   Signed and Held  Creatinine, serum  (enoxaparin (LOVENOX)    CrCl >/= 30 ml/min)  Weekly,   R     Comments: while on enoxaparin therapy    Signed and Held            Vitals/Pain Today's Vitals   04/20/23 1530 04/20/23 1532 04/20/23 1532  BP:   (!) 184/86  Pulse:   70  Resp:   (!) 21  Temp:   98.2 F (36.8 C)  TempSrc:   Oral  SpO2:   96%  Weight:  150 lb (68 kg)   Height:  5\' 3"  (1.6 m)    PainSc: 5       Isolation Precautions No active isolations  Medications Medications - No data to display  Mobility walks     Focused Assessments     R Recommendations: See Admitting Provider Note  Report given to:   Additional Notes:

## 2023-04-20 NOTE — Consult Note (Signed)
NEUROLOGY CONSULTATION NOTE   Date of service: April 20, 2023 Patient Name: Connie Bell MRN:  [id number] DOB:  [birth year] Reason for consult: "stroke-like symptoms" _ _ _   _ __   _ __ _ _  __ __   _ __   __ _  History of Present Illness  Connie Bell is a 82 y.o. female with PMH significant for DM2, HD, Osteoporosis, Depression, no OAC at home who presents to ED c/o left-sided weakness since this morning when she woke up around 0700. She stated she had a headache when she went to bed last night around 2300. She thought that the weakness was due to vertigo, which she has had for years. She took an over-the-counter vertigo medication, with no change in symptoms. She was able to call her daughter. Family noticed left-sided facial droop and slurred speech. Neurology was consulted for these stroke-like symptoms. CT head negative.  On exam, patient still has a slight left facial droop, but no dysarthria or left hemiparesis seen. Patient denies headache, denies recent falls at home, denies recent sickness or recent vaccinations.  Daughter at bedside. Patient lives alone, with daughter living about 15 minutes away. Patient's husband passed away 20 years ago.    ROS   Constitutional Denies weight loss, fever and chills.   HEENT Denies changes in vision and hearing.   Respiratory Denies SOB and cough.   CV Denies palpitations and CP   GI Denies abdominal pain, nausea, vomiting and diarrhea.   GU Denies dysuria and urinary frequency.   MSK Denies myalgia and joint pain.   Skin Denies rash and pruritus.   Neurological Denies headache and syncope.   Psychiatric Denies recent changes in mood. Denies anxiety and depression.    Past History   Past Medical History:  Diagnosis Date   COVID-19    Depression    Hyperlipemia    borderline not on Rx.   Osteoporosis    Rosacea    Skin cancer    on nose and neck 2 times/basal cell   Past Surgical History:  Procedure Laterality Date   COLONOSCOPY      HEMORRHOID SURGERY     Pt passed out past surgery due to pain!   PARTIAL HYSTERECTOMY     secondary to abn pap   Patient refuses     mammograms, chol treatment and vaccines   POLYPECTOMY     Family History  Problem Relation Age of Onset   Heart disease Mother        MI   Cancer Sister 46       lung cancer   Lung cancer Sister    Colon cancer Brother 77       died at 64   Heart disease Father        CHF   Stomach cancer Maternal Aunt    Colon polyps Neg Hx    Esophageal cancer Neg Hx    Rectal cancer Neg Hx    Breast cancer Neg Hx    Social History   Socioeconomic History   Marital status: Widowed    Spouse name: Not on file   Number of children: 2   Years of education: Not on file   Highest education level: Not on file  Occupational History   Occupation: employed with school    Employer: RETIRED  Tobacco Use   Smoking status: Never   Smokeless tobacco: Never  Vaping Use   Vaping Use: Never used  Substance  and Sexual Activity   Alcohol use: No   Drug use: No   Sexual activity: Not Currently  Other Topics Concern   Not on file  Social History Narrative   Widowed- husband was suicide   Children; twins   Social Determinants of Health   Financial Resource Strain: Low Risk  (07/19/2019)   Overall Financial Resource Strain (CARDIA)    Difficulty of Paying Living Expenses: Not hard at all  Food Insecurity: No Food Insecurity (07/19/2019)   Hunger Vital Sign    Worried About Running Out of Food in the Last Year: Never true    Ran Out of Food in the Last Year: Never true  Transportation Needs: No Transportation Needs (07/19/2019)   PRAPARE - Administrator, Civil Service (Medical): No    Lack of Transportation (Non-Medical): No  Physical Activity: Sufficiently Active (07/19/2019)   Exercise Vital Sign    Days of Exercise per Week: 7 days    Minutes of Exercise per Session: 40 min  Stress: No Stress Concern Present (07/19/2019)   [employer] of  Occupational Health - Occupational Stress Questionnaire    Feeling of Stress : Not at all  Social Connections: Not on file   Allergies  Allergen Reactions   Alendronate Sodium     REACTION: depressed, headaches, stomach aches   Morphine     REACTION: /whelts   Atorvastatin     REACTION: pt does not remember    Medications  (Not in a hospital admission)    Vitals   Vitals:   04/20/23 1532 04/20/23 1532  BP:  (!) 184/86  Pulse:  70  Resp:  (!) 21  Temp:  98.2 F (36.8 C)  TempSrc:  Oral  SpO2:  96%  Weight: 68 kg   Height: 5\' 3"  (1.6 m)      Body mass index is 26.57 kg/m.  Physical Exam   General: Laying comfortably in bed; in no acute distress.  HENT: Normal oropharynx and mucosa. Normal external appearance of ears and nose.  Neck: Supple, no pain or tenderness  CV: RRR. No peripheral edema.  Pulmonary: Symmetric Chest rise. Normal respiratory effort.  Abdomen: Soft to touch, non-tender.  Ext: No cyanosis, edema, or deformity  Skin: No rash. Normal palpation of skin.   Musculoskeletal: Normal digits and nails by inspection.   Neurologic Examination  Mental status/Cognition: Alert, oriented to self, place, month and year, good attention.  Speech/language: Fluent, comprehension intact, object naming intact, repetition intact.  Cranial nerves:   CN II Pupils equal and reactive to light, no VF deficits    CN III,IV,VI EOM intact, no gaze preference or deviation, no nystagmus    CN V normal sensation in V1, V2, and V3 segments bilaterally    CN VII Slight left facial droop   CN VIII normal hearing to speech    CN IX & X normal palatal elevation, no uvular deviation    CN XI 5/5 head turn and 5/5 shoulder shrug bilaterally    CN XII midline tongue protrusion    Motor:  Bulk and tone normal. No pronator drift. No tremor.  LUE: 5/5 bicep/tricep, 4+/5 deltoid, 5/5 grip RUE: 5/5 throughout, 5/5 grip LLE: 5/5 throughout, 5/5 dorsal and plantar flexion RLE: 5/5  throughout, 5/5 dorsal and plantar flexion  Sensation: Intact and symmetric to light touch throughout  Coordination/Complex Motor:  - Finger to Nose: intact bilaterally - Heel to shin: intact bilaterally - Rapid alternating movement: intact - Gait:  deferred  Labs   CBC:  Recent Labs  Lab 04/20/23 1548 04/20/23 1554  WBC 5.4  --   NEUTROABS 2.8  --   HGB 13.3 13.6  HCT 40.4 40.0  MCV 93.7  --   PLT 347  --     Basic Metabolic Panel:  Lab Results  Component Value Date   NA 141 04/20/2023   K 3.7 04/20/2023   CO2 25 04/20/2023   GLUCOSE 89 04/20/2023   BUN 11 04/20/2023   CREATININE 0.80 04/20/2023   CALCIUM 9.3 04/20/2023   GFRNONAA >60 04/20/2023   GFRAA >60 10/20/2019   Lipid Panel:  Lab Results  Component Value Date   LDLCALC 89 02/23/2020   HgbA1c:  Lab Results  Component Value Date   HGBA1C 5.8 02/23/2020   Urine Drug Screen:     Component Value Date/Time   LABOPIA NONE DETECTED 04/20/2023 1515   COCAINSCRNUR NONE DETECTED 04/20/2023 1515   LABBENZ NONE DETECTED 04/20/2023 1515   AMPHETMU NONE DETECTED 04/20/2023 1515   THCU NONE DETECTED 04/20/2023 1515   LABBARB NONE DETECTED 04/20/2023 1515    Alcohol Level     Component Value Date/Time   ETH <10 04/20/2023 1548    CT Head without contrast: No acute abnormality  MRI Brain: Pending   Impression   Acute Ischemic Stroke versus TIA versus Stroke-like Symptoms  Recommendations   - Frequent Neuro checks per stroke unit protocol - MRI Brain stroke protocol - Vascular imaging - CTA H + N - TTE - Lipid panel - Statin - will be started if LDL>70 or otherwise medically indicated - A1C - Antithrombotic - aspirin 81mg  daily along with plavix 75mg  daily x 21 days, followed by aspirin 81mg  daily alone - DVT ppx - SBP goal - <220, PRN labetalol if HR>60 and PRN Hydralazine if HR<60 - Telemetry monitoring for arrhythmia - 72h - Swallow screen - will be performed prior to PO intake - Stroke  education - will be given - PT/OT/SLP   ______________________________________________________________________   Thank you for the opportunity to take part in the care of this patient. If you have any further questions, please contact the neurology consultation attending.  Signed,   NEUROHOSPITALIST ADDENDUM Performed a face to face diagnostic evaluation.   I have reviewed the contents of history and physical exam as documented by PA/ARNP/Resident and agree with above documentation.  I have discussed and formulated the above plan as documented. Edits to the note have been made as needed.  Impression/Key exam findings/Plan: suspect minor ischemic stroke/TIA. Counseled her on the importance of coming to the hospital right away at the first sign of stroke.  Erick Blinks, MD [facility] [phone]   If 7pm to 7am, please call on call as listed on AMION.

## 2023-04-20 NOTE — Progress Notes (Signed)
Patient received from ED, alert and oriented X4, family members at bedside, tele monitor attached, call light within reach, bed alarm on, will continue to monitor

## 2023-04-20 NOTE — ED Provider Notes (Signed)
[place] EMERGENCY DEPARTMENT AT [facility] Provider Note   CSN: [id number] Arrival date & time: 04/20/23  1515     History  Chief Complaint  Patient presents with   Weakness    Connie Bell is a 82 y.o. female.   Weakness Patient presents for strokelike symptoms.  Medical history includes T2DM, depression, HLD, osteoporosis.  She is not on any home medications.  She denies any history of CVA.  Yesterday she was in her normal state of health.  She did have a generalized headache last night.  Other than that, she had no symptoms when she went to bed at 11 PM.  When she woke up this morning at 7 AM, patient had left hemibody weakness.  She attributed this to vertigo, which she has had in the past.  She took an over-the-counter medication to treat her vertigo.  She had continued symptoms.  Left-sided weakness caused her to fall.  She denies any areas of pain or suspected injury since the fall.  Symptoms improved throughout the day.  Family noted a left-sided facial droop which gradually improved during the day.  They noted that her speech was slightly slower than normal.  Currently, patient denies any symptoms other than the generalized weakness.     Home Medications Prior to Admission medications   Medication Sig Start Date End Date Taking? Authorizing Provider  Cholecalciferol (VITAMIN D3) 25 MCG (1000 UT) CAPS Take 1 capsule by mouth daily.    [provider]  ELDERBERRY PO Take by mouth.    [provider]  ibuprofen (ADVIL) 200 MG tablet Take 200 mg by mouth daily as needed for mild pain.    [provider]  Multiple Vitamin (MULTIVITAMIN WITH MINERALS) TABS tablet Take 1 tablet by mouth daily.    [provider]  NON Jerald Kief on Guard-Take one daily    [provider]  Omega-3 Fatty Acids (FISH OIL) 1000 MG CAPS Take 1pill by mouth daily    [provider]      Allergies    Alendronate sodium, Morphine,  and Atorvastatin    Review of Systems   Review of Systems  Neurological:  Positive for facial asymmetry and weakness.  All other systems reviewed and are negative.   Physical Exam Updated Vital Signs BP (!) 184/86 (BP Location: Right Arm)   Pulse 70   Temp 98.2 F (36.8 C) (Oral)   Resp (!) 21   Ht 5\' 3"  (1.6 m)   Wt 68 kg   SpO2 96%   BMI 26.57 kg/m  Physical Exam Vitals and nursing note reviewed.  Constitutional:      General: She is not in acute distress.    Appearance: Normal appearance. She is well-developed. She is not ill-appearing, toxic-appearing or diaphoretic.  HENT:     Head: Normocephalic and atraumatic.     Right Ear: External ear normal.     Left Ear: External ear normal.     Nose: Nose normal.     Mouth/Throat:     Mouth: Mucous membranes are moist.  Eyes:     Extraocular Movements: Extraocular movements intact.     Conjunctiva/sclera: Conjunctivae normal.  Cardiovascular:     Rate and Rhythm: Normal rate and regular rhythm.  Pulmonary:     Effort: Pulmonary effort is normal. No respiratory distress.  Abdominal:     General: There is no distension.     Palpations: Abdomen is soft.  Musculoskeletal:  General: No swelling. Normal range of motion.     Cervical back: Normal range of motion and neck supple.     Right lower leg: No edema.     Left lower leg: No edema.  Skin:    General: Skin is warm and dry.     Coloration: Skin is not jaundiced or pale.  Neurological:     Mental Status: She is alert and oriented to person, place, and time.     Cranial Nerves: Facial asymmetry present. No dysarthria.     Sensory: Sensation is intact. No sensory deficit.     Motor: Weakness (Very slight left-sided weakness, when compared to the right) and pronator drift present. No abnormal muscle tone.     Coordination: Coordination is intact.  Psychiatric:        Mood and Affect: Mood normal.     ED Results / Procedures / Treatments   Labs (all labs  ordered are listed, but only abnormal results are displayed) Labs Reviewed  URINALYSIS, ROUTINE W REFLEX MICROSCOPIC - Abnormal; Notable for the following components:      Result Value   Color, Urine STRAW (*)    Specific Gravity, Urine 1.003 (*)    Leukocytes,Ua SMALL (*)    Bacteria, UA RARE (*)    All other components within normal limits  PROTIME-INR  APTT  CBC  DIFFERENTIAL  COMPREHENSIVE METABOLIC PANEL  ETHANOL  RAPID URINE DRUG SCREEN, HOSP PERFORMED  I-STAT CHEM 8, ED    EKG None  Radiology CT HEAD WO CONTRAST  Result Date: 04/20/2023 CLINICAL DATA:  Neuro deficit, acute, stroke suspected. EXAM: CT HEAD WITHOUT CONTRAST TECHNIQUE: Contiguous axial images were obtained from the base of the skull through the vertex without intravenous contrast. RADIATION DOSE REDUCTION: This exam was performed according to the departmental dose-optimization program which includes automated exposure control, adjustment of the mA and/or kV according to patient size and/or use of iterative reconstruction technique. COMPARISON:  Head CT 08/20/2014.  MRI brain 08/25/2014. FINDINGS: Brain: No acute intracranial hemorrhage. Unchanged mild chronic small-vessel disease. Gray-white differentiation is otherwise preserved. No hydrocephalus or extra-axial collection. No mass effect or midline shift. Vascular: No hyperdense vessel or unexpected calcification. Skull: No calvarial fracture or suspicious bone lesion. Skull base is unremarkable. Sinuses/Orbits: Unremarkable. Other: None. IMPRESSION: 1. No acute intracranial abnormality. 2. Unchanged mild chronic small-vessel disease. Electronically Signed   By: Orvan Falconer M.D.   On: 04/20/2023 16:10    Procedures Procedures    Medications Ordered in ED Medications - No data to display  ED Course/ Medical Decision Making/ A&P                             Medical Decision Making Amount and/or Complexity of Data Reviewed Labs: ordered. Radiology:  ordered.   This patient presents to the ED for concern of strokelike symptoms, this involves an extensive number of treatment options, and is a complaint that carries with it a high risk of complications and morbidity.  The differential diagnosis includes CVA, TIA, complex migraine, seizure   Co morbidities that complicate the patient evaluation  Hypertension, T2DM, depression, HLD, osteoporosis   Additional history obtained:  Additional history obtained from patient's daughter External records from outside source obtained and reviewed including EMR   Lab Tests:  I Ordered, and personally interpreted labs.  The pertinent results include: Normal hemoglobin, no leukocytosis, normal electrolytes   Imaging Studies ordered:  I ordered imaging  studies including CT head, MRI brain I independently visualized and interpreted imaging which showed no acute findings on noncontrasted CT scan of head.  MRI brain pending at time of admission I agree with the radiologist interpretation   Cardiac Monitoring: / EKG:  The patient was maintained on a cardiac monitor.  I personally viewed and interpreted the cardiac monitored which showed an underlying rhythm of: Atrial flutter   Consultations Obtained:  I requested consultation with the neurologist, Dr. Derry Lory,  and discussed lab and imaging findings as well as pertinent plan - they recommend: Admission for stroke workup   Problem List / ED Course / Critical interventions / Medication management  Patient presents for strokelike symptoms starting this morning at 7 AM when she awakened.  At the time, she noted left hemibody weakness.  Family noticed left-sided facial droop.  Patient denies any sensation of numbness.  She had normal speech, although daughter feels like her responses are slowed.  Symptoms gradually improved throughout the day.  On arrival in the ED, patient does appear to have some very slight left-sided weakness on exam.  She  has very slight facial asymmetry.  I suspect a small stroke.  EKG shows evidence of atrial flutter, which may be a contributing factor.  Noncontrasted CT scan did not show any acute findings.  MRI brain was ordered.  Vital signs on arrival notable for hypertension.  Given concern of recent stroke, plan will be for permissive hypertension.  Case was discussed with neurologist on-call, who recommends admission to medicine for further stroke workup.  Patient was admitted for further management.   Social Determinants of Health:  Lives independently        Final Clinical Impression(s) / ED Diagnoses Final diagnoses:  Stroke-like symptoms    Rx / DC Orders ED Discharge Orders     None         Gloris Manchester, MD 04/20/23 1645

## 2023-04-20 NOTE — H&P (Addendum)
[facility] History and Physical  Connie Bell ZOX:[id number] DOB: 10-12-1941 DOA: 04/20/2023  Referring physician: ED  PCP: [name], MD   Patient is coming from: Home  Chief Complaint: Left-sided weakness, slurred speech  HPI: Patient is 82 years old female with past medical history of depression, hyperlipidemia, hypertension not on medications presented to the hospital with left-sided weakness and slurred speech noted this morning at 7.  Patient stated that she had gone to bed yesterday with some headache and had some little dizziness but when she woke up in the morning had trouble with speech, had gait imbalance and had a fall.  Patient did report to her family later on in the day and was brought into the hospital.  Patient denies any nausea, vomiting, diarrhea, fever, chills, cough, congestion or chest pain.  Denies any urinary urgency, frequency or dysuria.  Denies any recent travel or sick contacts.  Patient is independent at baseline and is tutored by precaution.  As per the family patient is mostly healthy and does not like to take medications.  She has had recent PCP follow-up and her blood pressure was normal at that time.  She has been taking over-the-counter medications for vertigo.  In the ED, patient was noted to have blood pressure elevated at 184/86.  Urine drug screen was negative.  Chemistry was negative including LFTs.  Urine analysis was negative.  CBC within normal range.  CT head scan did not show any acute findings but mild chronic small vessel disease.  EKG in the ED was concerning for possible rate controlled atrial flutter.  Subsequent telemetry rhythm showed normal sinus rhythm.  Neurology was consulted from the ED and patient was considered for admission to the hospital for further workup.  Assessment and Plan  TIA.  Rule out stroke.   Patient with improving slurred speech and weakness but still with pronator drift and mild facial droop.  Neurology has  been notified.  Will do stroke workup.  Stroke protocol utilized.  Keep n.p.o. until swallow evaluation.  PT OT evaluation.  Check hemoglobin A1c, TSH, lipid panel.  Keep permissive hypertension for now.  Check MRI of the brain.  Carotid with ultrasound.  Follow neurology recommendations.  Check 2D echocardiogram.  atrial flutter on the initial EKG.  Telemetry with sinus rhythm during my interaction.  Check 2D echocardiogram.  History of hypertension.  Previous visit without high blood pressure as per the patient.  Accelerated when she came in.  Will continue to monitor for now.  Hyperlipidemia as per the chart.  Will check a lipid panel.  DVT Prophylaxis: Lovenox subcu.  Review of Systems:  All systems were reviewed and were negative unless otherwise mentioned in the HPI   Past Medical History:  Diagnosis Date   COVID-19    Depression    Hyperlipemia    borderline not on Rx.   Osteoporosis    Rosacea    Skin cancer    on nose and neck 2 times/basal cell   Past Surgical History:  Procedure Laterality Date   COLONOSCOPY     HEMORRHOID SURGERY     Pt passed out past surgery due to pain!   PARTIAL HYSTERECTOMY     secondary to abn pap   Patient refuses     mammograms, chol treatment and vaccines   POLYPECTOMY      Social History:  reports that she has never smoked. She has never used smokeless tobacco. She reports that she does not drink  alcohol and does not use drugs.  Allergies  Allergen Reactions   Alendronate Sodium     REACTION: depressed, headaches, stomach aches   Morphine     REACTION: /whelts   Atorvastatin     REACTION: pt does not remember    Family History  Problem Relation Age of Onset   Heart disease Mother        MI   Cancer Sister 65       lung cancer   Lung cancer Sister    Colon cancer Brother 61       died at 75   Heart disease Father        CHF   Stomach cancer Maternal Aunt    Colon polyps Neg Hx    Esophageal cancer Neg Hx    Rectal  cancer Neg Hx    Breast cancer Neg Hx      Prior to Admission medications   Medication Sig Start Date End Date Taking? Authorizing Provider  Cholecalciferol (VITAMIN D3) 25 MCG (1000 UT) CAPS Take 1 capsule by mouth daily.   Yes [provider]  Multiple Vitamin (MULTIVITAMIN WITH MINERALS) TABS tablet Take 1 tablet by mouth daily.   Yes [provider]    Physical Exam: Vitals:   04/20/23 1532 04/20/23 1532  BP:  (!) 184/86  Pulse:  70  Resp:  (!) 21  Temp:  98.2 F (36.8 C)  TempSrc:  Oral  SpO2:  96%  Weight: 68 kg   Height: 5\' 3"  (1.6 m)    Wt Readings from Last 3 Encounters:  04/20/23 68 kg  02/18/23 71.8 kg  02/15/20 63 kg   Body mass index is 26.57 kg/m.  General:  Average built, not in obvious distress HENT: Normocephalic, No scleral pallor or icterus noted. Oral mucosa is moist.  Chest:  Clear breath sounds.  . No crackles or wheezes.  CVS: S1 &S2 heard. No murmur.  Regular rate and rhythm. Abdomen: Soft, nontender, nondistended.  Bowel sounds are heard. No abdominal mass palpated Extremities: No cyanosis, clubbing or edema.  Peripheral pulses are palpable. Psych: Alert, awake and oriented, normal mood CNS: Mild left facial droop, right upper extremity weakness plus, pronator drift, poor grip.  Speech distinct. Skin: Warm and dry.  No rashes noted.  Labs on Admission:   CBC: Recent Labs  Lab 04/20/23 1548 04/20/23 1554  WBC 5.4  --   NEUTROABS 2.8  --   HGB 13.3 13.6  HCT 40.4 40.0  MCV 93.7  --   PLT 347  --     Basic Metabolic Panel: Recent Labs  Lab 04/20/23 1548 04/20/23 1554  NA 139 141  K 3.7 3.7  CL 105 104  CO2 25  --   GLUCOSE 95 89  BUN 10 11  CREATININE 0.79 0.80  CALCIUM 9.3  --     Liver Function Tests: Recent Labs  Lab 04/20/23 1548  AST 17  ALT 20  ALKPHOS 75  BILITOT 0.8  PROT 7.7  ALBUMIN 4.0   No results for input(s): "LIPASE", "AMYLASE" in the last 168 hours. No results for input(s):  "AMMONIA" in the last 168 hours.  Cardiac Enzymes: No results for input(s): "CKTOTAL", "CKMB", "CKMBINDEX", "TROPONINI" in the last 168 hours.  BNP (last 3 results) No results for input(s): "BNP" in the last 8760 hours.  ProBNP (last 3 results) No results for input(s): "PROBNP" in the last 8760 hours.  CBG: No results for input(s): "GLUCAP" in the  last 168 hours.  Lipase  No results found for: "LIPASE"   Urinalysis    Component Value Date/Time   COLORURINE STRAW (A) 04/20/2023 1515   APPEARANCEUR CLEAR 04/20/2023 1515   LABSPEC 1.003 (L) 04/20/2023 1515   PHURINE 7.0 04/20/2023 1515   GLUCOSEU NEGATIVE 04/20/2023 1515   HGBUR NEGATIVE 04/20/2023 1515   HGBUR negative 06/28/2007 1538   BILIRUBINUR NEGATIVE 04/20/2023 1515   BILIRUBINUR Neg. 11/24/2012 1552   KETONESUR NEGATIVE 04/20/2023 1515   PROTEINUR NEGATIVE 04/20/2023 1515   UROBILINOGEN 0.2 08/20/2014 0412   NITRITE NEGATIVE 04/20/2023 1515   LEUKOCYTESUR SMALL (A) 04/20/2023 1515     Drugs of Abuse     Component Value Date/Time   LABOPIA NONE DETECTED 04/20/2023 1515   COCAINSCRNUR NONE DETECTED 04/20/2023 1515   LABBENZ NONE DETECTED 04/20/2023 1515   AMPHETMU NONE DETECTED 04/20/2023 1515   THCU NONE DETECTED 04/20/2023 1515   LABBARB NONE DETECTED 04/20/2023 1515      Radiological Exams on Admission: CT HEAD WO CONTRAST  Result Date: 04/20/2023 CLINICAL DATA:  Neuro deficit, acute, stroke suspected. EXAM: CT HEAD WITHOUT CONTRAST TECHNIQUE: Contiguous axial images were obtained from the base of the skull through the vertex without intravenous contrast. RADIATION DOSE REDUCTION: This exam was performed according to the departmental dose-optimization program which includes automated exposure control, adjustment of the mA and/or kV according to patient size and/or use of iterative reconstruction technique. COMPARISON:  Head CT 08/20/2014.  MRI brain 08/25/2014. FINDINGS: Brain: No acute intracranial hemorrhage.  Unchanged mild chronic small-vessel disease. Gray-white differentiation is otherwise preserved. No hydrocephalus or extra-axial collection. No mass effect or midline shift. Vascular: No hyperdense vessel or unexpected calcification. Skull: No calvarial fracture or suspicious bone lesion. Skull base is unremarkable. Sinuses/Orbits: Unremarkable. Other: None. IMPRESSION: 1. No acute intracranial abnormality. 2. Unchanged mild chronic small-vessel disease. Electronically Signed   By: Orvan Falconer M.D.   On: 04/20/2023 16:10    EKG: Personally reviewed by me which shows question atrial flutter.   Consultant: Neurology  Code Status: Full code  Microbiology none  Antibiotics: None  Family Communication:  Patients' condition and plan of care including tests being ordered have been discussed with the patient and the patient's family-daughter at bedside who indicate understanding and agree with the plan.   Status is: Observation The patient remains OBS appropriate and will d/c before 2 midnights.   Severity of Illness: The appropriate patient status for this patient is OBSERVATION. Observation status is judged to be reasonable and necessary in order to provide the required intensity of service to ensure the patient's safety. The patient's presenting symptoms, physical exam findings, and initial radiographic and laboratory data in the context of their medical condition is felt to place them at decreased risk for further clinical deterioration. Furthermore, it is anticipated that the patient will be medically stable for discharge from the hospital within 2 midnights of admission.   Signed, Joycelyn Das, MD [facility] 04/20/2023

## 2023-04-20 NOTE — ED Triage Notes (Signed)
Pt arrives with c/o left sided weakness and facial droop. Per pt, she noticed symptoms when she woke up at 7am this morning. Per pt, she developed a headache around 9pm last night and when she went to bed at 23:30 last night she was normal. Pt denies CP or SOB. Pt endorses back pain. Pt denies sensation deficit or vision changes. Pt a&ox4.

## 2023-04-21 ENCOUNTER — Observation Stay (HOSPITAL_COMMUNITY): Payer: Medicare Other

## 2023-04-21 ENCOUNTER — Observation Stay (HOSPITAL_BASED_OUTPATIENT_CLINIC_OR_DEPARTMENT_OTHER): Payer: Medicare Other

## 2023-04-21 DIAGNOSIS — F32A Depression, unspecified: Secondary | ICD-10-CM | POA: Diagnosis not present

## 2023-04-21 DIAGNOSIS — G459 Transient cerebral ischemic attack, unspecified: Secondary | ICD-10-CM

## 2023-04-21 DIAGNOSIS — R42 Dizziness and giddiness: Secondary | ICD-10-CM | POA: Diagnosis not present

## 2023-04-21 DIAGNOSIS — E785 Hyperlipidemia, unspecified: Secondary | ICD-10-CM | POA: Diagnosis not present

## 2023-04-21 DIAGNOSIS — I10 Essential (primary) hypertension: Secondary | ICD-10-CM | POA: Diagnosis not present

## 2023-04-21 LAB — ECHOCARDIOGRAM COMPLETE
Area-P 1/2: 2.83 cm2
Height: 63 in
S' Lateral: 3.6 cm
Weight: 2400 oz

## 2023-04-21 LAB — LIPID PANEL
Cholesterol: 230 mg/dL — ABNORMAL HIGH (ref 0–200)
HDL: 41 mg/dL (ref >40)
LDL Cholesterol: 131 mg/dL — ABNORMAL HIGH (ref 0–99)
Total CHOL/HDL Ratio: 5.6 RATIO
Triglycerides: 290 mg/dL — ABNORMAL HIGH (ref <150)
VLDL: 58 mg/dL — ABNORMAL HIGH (ref 0–40)

## 2023-04-21 MED ORDER — ROSUVASTATIN CALCIUM 20 MG PO TABS: 20.0000 mg | ORAL_TABLET | Freq: Every day | ORAL | 2 refills | Status: DC

## 2023-04-21 MED ORDER — CLOPIDOGREL BISULFATE 75 MG PO TABS: 75.0000 mg | ORAL_TABLET | Freq: Every day | ORAL | 0 refills | Status: AC

## 2023-04-21 MED ORDER — ROSUVASTATIN CALCIUM 20 MG PO TABS
20.0000 mg | ORAL_TABLET | Freq: Every day | ORAL | Status: DC
  Administered 2023-04-21: 20 mg via ORAL
  Filled 2023-04-21: qty 1

## 2023-04-21 MED ORDER — ASPIRIN 81 MG PO TBEC: 81.0000 mg | DELAYED_RELEASE_TABLET | Freq: Every day | ORAL | 2 refills | Status: AC

## 2023-04-21 NOTE — Care Management Obs Status (Signed)
MEDICARE OBSERVATION STATUS NOTIFICATION   Patient Details  Name: Connie Bell MRN: [id number] Date of Birth: 1941-05-11   Medicare Observation Status Notification Given:  Yes    Kermit Balo, RN 04/21/2023, 3:47 PM

## 2023-04-21 NOTE — Plan of Care (Signed)
  Problem: Education: Goal: Knowledge of disease or condition will improve Outcome: Progressing   Problem: Ischemic Stroke/TIA Tissue Perfusion: Goal: Complications of ischemic stroke/TIA will be minimized Outcome: Progressing   

## 2023-04-21 NOTE — Progress Notes (Signed)
OT Cancellation Note  Patient Details Name: Connie Bell MRN: [id number] DOB: July 30, 1941   Cancelled Treatment:    Reason Eval/Treat Not Completed: Fatigue/lethargy limiting ability to participate (sleeping - family present asking ot let patient sleep at this time)  Mateo Flow 04/21/2023, 2:04 PM

## 2023-04-21 NOTE — Progress Notes (Addendum)
STROKE TEAM PROGRESS NOTE   INTERVAL HISTORY Is seen in her room with her daughter at the bedside.  Yesterday morning, she woke up with new onset left-sided weakness as well as left facial droop. MRI shows right pontine stroke but it is not mentioned by the radiologist and based on  his report.  Vitals:   04/20/23 1931 04/20/23 2337 04/21/23 0400 04/21/23 0738  BP: (!) 186/68 (!) 174/74 129/71 (!) 140/69  Pulse: 62 (!) 59 64 61  Resp: 18 16 17 18   Temp: 98 F (36.7 C) 99.3 F (37.4 C) 97.8 F (36.6 C) 98.3 F (36.8 C)  TempSrc: Oral Oral Oral Oral  SpO2: 96% 99% 96% 96%  Weight:      Height:       CBC:  Recent Labs  Lab 04/20/23 1548 04/20/23 1554  WBC 5.4  --   NEUTROABS 2.8  --   HGB 13.3 13.6  HCT 40.4 40.0  MCV 93.7  --   PLT 347  --    Basic Metabolic Panel:  Recent Labs  Lab 04/20/23 1548 04/20/23 1554  NA 139 141  K 3.7 3.7  CL 105 104  CO2 25  --   GLUCOSE 95 89  BUN 10 11  CREATININE 0.79 0.80  CALCIUM 9.3  --    Lipid Panel:  Recent Labs  Lab 04/21/23 0637  CHOL 230*  TRIG 290*  HDL 41  CHOLHDL 5.6  VLDL 58*  LDLCALC 131*   HgbA1c: No results for input(s): "HGBA1C" in the last 168 hours. Urine Drug Screen:  Recent Labs  Lab 04/20/23 1515  LABOPIA NONE DETECTED  COCAINSCRNUR NONE DETECTED  LABBENZ NONE DETECTED  AMPHETMU NONE DETECTED  THCU NONE DETECTED  LABBARB NONE DETECTED    Alcohol Level  Recent Labs  Lab 04/20/23 1548  ETH <10    IMAGING past 24 hours ECHOCARDIOGRAM COMPLETE  Result Date: 04/21/2023    ECHOCARDIOGRAM REPORT   Patient Name:   Connie Bell Date of Exam: 04/21/2023 Medical Rec #:  [id number]      Height:       63.0 in Accession #:    [id number]     Weight:       150.0 lb Date of Birth:  04/26/1941     BSA:          1.711 m Patient Age:    81 years       BP:           140/69 mmHg Patient Gender: F              HR:           62 bpm. Exam Location:  Inpatient Procedure: 2D Echo, Color Doppler, Cardiac Doppler  and 3D Echo Indications:    TIA G45.9  History:        Patient has no prior history of Echocardiogram examinations.                 TIA; Risk Factors:Hypertension, Dyslipidemia and Non-Smoker.  Sonographer:    Aron Baba Referring Phys: [id number] HYQMVH POKHREL IMPRESSIONS  1. Left ventricular ejection fraction, by estimation, is 55 to 60%. The left ventricle has normal function. The left ventricle has no regional wall motion abnormalities. Left ventricular diastolic parameters were normal.  2. Right ventricular systolic function is normal. The right ventricular size is normal. There is normal pulmonary artery systolic pressure.  3. Left atrial size was moderately dilated.  4. Right atrial  size was mildly dilated.  5. The mitral valve is normal in structure. No evidence of mitral valve regurgitation. No evidence of mitral stenosis.  6. The aortic valve is tricuspid. There is mild calcification of the aortic valve. Aortic valve regurgitation is not visualized. Aortic valve sclerosis/calcification is present, without any evidence of aortic stenosis.  7. The inferior vena cava is dilated in size with >50% respiratory variability, suggesting right atrial pressure of 8 mmHg. FINDINGS  Left Ventricle: Left ventricular ejection fraction, by estimation, is 55 to 60%. The left ventricle has normal function. The left ventricle has no regional wall motion abnormalities. The left ventricular internal cavity size was normal in size. There is  no left ventricular hypertrophy. Left ventricular diastolic parameters were normal. Right Ventricle: The right ventricular size is normal. No increase in right ventricular wall thickness. Right ventricular systolic function is normal. There is normal pulmonary artery systolic pressure. The tricuspid regurgitant velocity is 2.11 m/s, and  with an assumed right atrial pressure of 8 mmHg, the estimated right ventricular systolic pressure is 25.8 mmHg. Left Atrium: Left atrial size was  moderately dilated. Right Atrium: Right atrial size was mildly dilated. Pericardium: There is no evidence of pericardial effusion. Mitral Valve: The mitral valve is normal in structure. No evidence of mitral valve regurgitation. No evidence of mitral valve stenosis. Tricuspid Valve: The tricuspid valve is normal in structure. Tricuspid valve regurgitation is not demonstrated. No evidence of tricuspid stenosis. Aortic Valve: The aortic valve is tricuspid. There is mild calcification of the aortic valve. Aortic valve regurgitation is not visualized. Aortic valve sclerosis/calcification is present, without any evidence of aortic stenosis. Pulmonic Valve: The pulmonic valve was normal in structure. Pulmonic valve regurgitation is not visualized. No evidence of pulmonic stenosis. Aorta: The aortic root is normal in size and structure. Venous: The inferior vena cava is dilated in size with greater than 50% respiratory variability, suggesting right atrial pressure of 8 mmHg. IAS/Shunts: No atrial level shunt detected by color flow Doppler.  LEFT VENTRICLE PLAX 2D LVIDd:         5.00 cm   Diastology LVIDs:         3.60 cm   LV e' medial:    11.40 cm/s LV PW:         0.80 cm   LV E/e' medial:  7.9 LV IVS:        0.70 cm   LV e' lateral:   8.27 cm/s LVOT diam:     1.50 cm   LV E/e' lateral: 10.9 LV SV:         38 LV SV Index:   22 LVOT Area:     1.77 cm                           3D Volume EF:                          3D EF:        54 %                          LV EDV:       115 ml                          LV ESV:       52 ml  LV SV:        62 ml RIGHT VENTRICLE RV S prime:     12.20 cm/s TAPSE (M-mode): 1.6 cm LEFT ATRIUM             Index        RIGHT ATRIUM           Index LA diam:        4.90 cm 2.86 cm/m   RA Area:     15.60 cm LA Vol (A2C):   81.0 ml 47.34 ml/m  RA Volume:   37.80 ml  22.09 ml/m LA Vol (A4C):   52.0 ml 30.39 ml/m LA Biplane Vol: 66.0 ml 38.57 ml/m  AORTIC VALVE LVOT Vmax:    87.50 cm/s LVOT Vmean:  58.300 cm/s LVOT VTI:    0.216 m  AORTA Ao Root diam: 3.10 cm Ao Asc diam:  2.90 cm MITRAL VALVE               TRICUSPID VALVE MV Area (PHT): 2.83 cm    TR Peak grad:   17.8 mmHg MV Decel Time: 268 msec    TR Vmax:        211.00 cm/s MV E velocity: 89.80 cm/s MV A velocity: 76.40 cm/s  SHUNTS MV E/A ratio:  1.18        Systemic VTI:  0.22 m                            Systemic Diam: 1.50 cm Arvilla Meres MD Electronically signed by Arvilla Meres MD Signature Date/Time: 04/21/2023/11:10:31 AM    Final    CT ANGIO HEAD NECK W WO CM  Result Date: 04/20/2023 CLINICAL DATA:  Transient ischemic attack EXAM: CT ANGIOGRAPHY HEAD AND NECK WITH AND WITHOUT CONTRAST TECHNIQUE: Multidetector CT imaging of the head and neck was performed using the standard protocol during bolus administration of intravenous contrast. Multiplanar CT image reconstructions and MIPs were obtained to evaluate the vascular anatomy. Carotid stenosis measurements (when applicable) are obtained utilizing NASCET criteria, using the distal internal carotid diameter as the denominator. RADIATION DOSE REDUCTION: This exam was performed according to the departmental dose-optimization program which includes automated exposure control, adjustment of the mA and/or kV according to patient size and/or use of iterative reconstruction technique. CONTRAST:  75mL OMNIPAQUE IOHEXOL 350 MG/ML SOLN COMPARISON:  None Available. FINDINGS: CTA NECK FINDINGS SKELETON: There is no bony spinal canal stenosis. No lytic or blastic lesion. OTHER NECK: Normal pharynx, larynx and major salivary glands. No cervical lymphadenopathy. Unremarkable thyroid gland. UPPER CHEST: No pneumothorax or pleural effusion. No nodules or masses. AORTIC ARCH: There is calcific atherosclerosis of the aortic arch. There is no aneurysm, dissection or hemodynamically significant stenosis of the visualized portion of the aorta. Conventional 3 vessel aortic branching  pattern. The visualized proximal subclavian arteries are widely patent. RIGHT CAROTID SYSTEM: No dissection, occlusion or aneurysm. Mild atherosclerotic calcification at the carotid bifurcation without hemodynamically significant stenosis. LEFT CAROTID SYSTEM: Normal without aneurysm, dissection or stenosis. VERTEBRAL ARTERIES: Left dominant configuration. Both origins are clearly patent. There is no dissection, occlusion or flow-limiting stenosis to the skull base (V1-V3 segments). CTA HEAD FINDINGS POSTERIOR CIRCULATION: --Vertebral arteries: Atherosclerotic calcification of the right V4 segment without hemodynamically significant stenosis. --Inferior cerebellar arteries: Normal. --Basilar artery: Mild multifocal narrowing --Superior cerebellar arteries: Normal. --Posterior cerebral arteries (PCA): Moderate narrowing of the right proximal P2 segment. Normal left. ANTERIOR CIRCULATION: --Intracranial internal carotid arteries: Normal. --  Anterior cerebral arteries (ACA): Normal. Both A1 segments are present. Patent anterior communicating artery (a-comm). --Middle cerebral arteries (MCA): Normal. VENOUS SINUSES: As permitted by contrast timing, patent. ANATOMIC VARIANTS: Fetal origin of the right posterior cerebral artery. Review of the MIP images confirms the above findings. IMPRESSION: 1. No emergent large vessel occlusion. 2. Moderate narrowing of the right posterior cerebral artery proximal P2 segment. 3. Mild atherosclerotic calcification of the right carotid bifurcation and right V4 segment without hemodynamically significant stenosis. Aortic atherosclerosis (ICD10-I70.0). Electronically Signed   By: Deatra Robinson M.D.   On: 04/20/2023 22:59   MR BRAIN WO CONTRAST  Result Date: 04/20/2023 CLINICAL DATA:  Transient ischemic attack EXAM: MRI HEAD WITHOUT CONTRAST TECHNIQUE: Multiplanar, multiecho pulse sequences of the brain and surrounding structures were obtained without intravenous contrast. COMPARISON:   08/25/2014 FINDINGS: Brain: No acute infarct, mass effect or extra-axial collection. No acute or chronic hemorrhage. There is multifocal hyperintense T2-weighted signal within the white matter. Parenchymal volume and CSF spaces are normal. The midline structures are normal. Vascular: Major flow voids are preserved. Skull and upper cervical spine: Normal calvarium and skull base. Visualized upper cervical spine and soft tissues are normal. Sinuses/Orbits:No paranasal sinus fluid levels or advanced mucosal thickening. No mastoid or middle ear effusion. Normal orbits. IMPRESSION: 1. No acute intracranial abnormality. 2. Findings of chronic small vessel ischemia. Electronically Signed   By: Deatra Robinson M.D.   On: 04/20/2023 22:22   CT HEAD WO CONTRAST  Result Date: 04/20/2023 CLINICAL DATA:  Neuro deficit, acute, stroke suspected. EXAM: CT HEAD WITHOUT CONTRAST TECHNIQUE: Contiguous axial images were obtained from the base of the skull through the vertex without intravenous contrast. RADIATION DOSE REDUCTION: This exam was performed according to the departmental dose-optimization program which includes automated exposure control, adjustment of the mA and/or kV according to patient size and/or use of iterative reconstruction technique. COMPARISON:  Head CT 08/20/2014.  MRI brain 08/25/2014. FINDINGS: Brain: No acute intracranial hemorrhage. Unchanged mild chronic small-vessel disease. Gray-white differentiation is otherwise preserved. No hydrocephalus or extra-axial collection. No mass effect or midline shift. Vascular: No hyperdense vessel or unexpected calcification. Skull: No calvarial fracture or suspicious bone lesion. Skull base is unremarkable. Sinuses/Orbits: Unremarkable. Other: None. IMPRESSION: 1. No acute intracranial abnormality. 2. Unchanged mild chronic small-vessel disease. Electronically Signed   By: Orvan Falconer M.D.   On: 04/20/2023 16:10    PHYSICAL EXAM General: Alert, well-nourished,  well-developed elderly patient in no acute distress Respiratory: Regular, unlabored respirations on room air  NEURO:  Mental Status: AA&Ox3  Speech/Language: speech is without dysarthria or aphasia.    Cranial Nerves:  II: PERRL. Visual fields full.  III, IV, VI: EOMI. Eyelids elevate symmetrically.  V: Sensation is intact to light touch and symmetrical to face.  VII: Left-sided facial droop VIII: hearing intact to voice. IX, X: Phonation is normal.  ZO:XWRUEAVW shrug 5/5. XII: tongue is midline without fasciculations. Motor: 5/5 strength to all muscle groups tested with diminished left hand grip strength, diminished left fine finger movements and orbiting of right arm over left Tone: is normal and bulk is normal Sensation- Intact to light touch bilaterally. Sharp/Dull   Vibration.   Coordination: FTN intact bilaterally, drift of left arm and leg Gait- deferred    ASSESSMENT/PLAN Connie Bell is a 82 y.o. female with history of diabetes, hyperlipidemia, osteoporosis and depression presenting with new onset left-sided weakness as well as left facial droop.  MRI shows right pontine stroke.  Stroke:  right  pontine stroke Etiology: Likely small vessel disease CT head No acute abnormality.  CTA head & neck no emergent LVO, moderate narrowing of right PCA P2 segment and mild atherosclerotic calcification of right carotid bifurcation and right V4 segment MRI right pontine stroke 2D Echo EF 55 to 60%, mildly dilated left atrium, no atrial level shunt Consider 30-day cardiac monitor at discharge given dilated left atrium LDL 131 HgbA1c No results found for requested labs within last 1095 days. VTE prophylaxis -Lovenox    Diet   Diet Heart Room service appropriate? Yes; Fluid consistency: Thin   No antithrombotic prior to admission, now on aspirin 81 mg daily and clopidogrel 75 mg daily for 3 weeks followed by aspirin alone. Therapy recommendations: Outpatient PT Disposition:  Pending  Hypertension Home meds: None Stable Permissive hypertension (OK if < 220/120) but gradually normalize in 5-7 days Long-term BP goal normotensive  Hyperlipidemia Home meds: None LDL 131, goal < 70 Add rosuvastatin 20 mg daily Continue statin at discharge   Other Stroke Risk Factors Advanced Age >/= 64   Other Active Problems None  Hospital day # 0  Cortney E Ernestina Columbia , MSN, AGACNP-BC [facility] See Amion for schedule and pager information 04/21/2023 1:37 PM  I have personally obtained history,examined this patient, reviewed notes, independently viewed imaging studies, participated in medical decision making and plan of care.ROS completed by me personally and pertinent positives fully documented  I have made any additions or clarifications directly to the above note. Agree with note above.  Patient presented with sudden onset of left hemiparesis secondary to right findings infarct likely from small vessel disease.   Marland Kitchen  Recommend aspirin and Plavix for 3 weeks followed by aspirin alone.  Continue ongoing stroke workup.  Aggressive risk factor modifications.  Patient offered transfer to consider participation in the [place]  stroke trial but declines.  Physical Occupational Therapy consults.  Discussed with Dr.Pokhrel.  Discussed with patient's daughter and son.  Greater than 50% time during this 50-minute visit was spent on counseling and coordination of care about 70 pontine stroke and left hemiparesis and discussion about evaluation, treatment and prevention and answering questions.     Delia Heady, MD Medical Director [facility] Pager: [phone] 04/21/2023 2:58 PM   To contact Stroke Continuity provider, please refer to [email or web address]. After hours, contact General Neurology

## 2023-04-21 NOTE — Progress Notes (Signed)
  Echocardiogram 2D Echocardiogram has been performed.  Connie Bell 04/21/2023, 10:40 AM

## 2023-04-21 NOTE — Evaluation (Signed)
Physical Therapy Evaluation Patient Details Name: Connie Bell MRN: [id number] DOB: 1940/11/20 Today's Date: 04/21/2023  History of Present Illness  Pt is 82 yo female who presents on 04/20/23 with L weakness, slurred speech, and a fall. MRI showed R brainstem CVA. PMH: depression, hyperlipidemia, hypertension, vertigo  Clinical Impression  Pt admitted with above diagnosis. 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 Pt mobilizing well but still having some strength deficits LUE and LLE as well as decreased coordination L side. Pt was independent with ambulation without AD PTA. Today requires RW for safe, independent ambulation. Began to work on ambulation with SPC and no AD to progress back to independence. Recommend outpt PT to continue with this work.  Pt currently with functional limitations due to the deficits listed below (see PT Problem List). Pt will benefit from acute skilled PT to increase their independence and safety with mobility to allow discharge.          Recommendations for follow up therapy are one component of a multi-disciplinary discharge planning process, led by the attending physician.  Recommendations may be updated based on patient status, additional functional criteria and insurance authorization.  Follow Up Recommendations       Assistance Recommended at Discharge Intermittent Supervision/Assistance  Patient can return home with the following  A little help with walking and/or transfers;A little help with bathing/dressing/bathroom;Assistance with cooking/housework;Assist for transportation    Equipment Recommendations Rolling walker (2 wheels)  Recommendations for Other Services       Functional Status Assessment Patient has had a recent decline in their functional status and demonstrates the ability to make significant improvements in function in a reasonable and predictable amount of time.      Precautions / Restrictions Precautions Precautions: Fall Precaution Comments: pt fell yesterday, got self up Restrictions Weight Bearing Restrictions: No      Mobility  Bed Mobility Overal bed mobility: Modified Independent             General bed mobility comments: pt able to sit straight up in bed, remove covers, and come to EOB. Increased time needed for use of LUE but no assist    Transfers Overall transfer level: Needs assistance Equipment used: None, Rolling walker (2 wheels) Transfers: Sit to/from Stand Sit to Stand: Min guard           General transfer comment: increased effort for sit>stand, pt reports this has been an issue at baseline as well. Min A L side to steady    Ambulation/Gait Ambulation/Gait assistance: Supervision, Min assist Gait Distance (Feet): 300 Feet Assistive device: Rolling walker (2 wheels), None, Straight cane Gait Pattern/deviations: Step-through pattern, Decreased weight shift to left, Decreased stance time - left, Decreased step length - left, Ataxic Gait velocity: decreased Gait velocity interpretation: 1.31 - 2.62 ft/sec, indicative of limited community ambulator   General Gait Details: pt stable with use of RW but did not use AD at baseline. Instructed her to use when home alone or when feeling weaker. With SPC on R and no AD pt ambulates with the need for min A on L side due to increased reaction time L and occasional misstep. Noted mild ataxia with placement of LLE  Stairs Stairs: Yes Stairs assistance: Supervision Stair Management: One rail Left, Alternating pattern, Forwards Number of Stairs: 5 General stair comments: pt able to ascend with LLE and descend with RLE but mvmts slowed  Wheelchair Mobility  Modified Rankin (Stroke Patients Only) Modified Rankin (Stroke Patients Only) Pre-Morbid Rankin Score: No symptoms Modified Rankin: Moderately severe disability     Balance Overall balance assessment: Needs  assistance Sitting-balance support: No upper extremity supported, Feet supported Sitting balance-Leahy Scale: Good     Standing balance support: No upper extremity supported, During functional activity Standing balance-Leahy Scale: Fair Standing balance comment: pt able to maintain standing balance without support but unable to quickly correct for L LOB                             Pertinent Vitals/Pain Pain Assessment Pain Assessment: No/denies pain    Home Living Family/patient expects to be discharged to:: Private residence Living Arrangements: Alone Available Help at Discharge: Family;Available 24 hours/day Type of Home: House Home Access: Stairs to enter Entrance Stairs-Rails: Right Entrance Stairs-Number of Steps: 3 Alternate Level Stairs-Number of Steps: flight Home Layout: Two level;Able to live on main level with bedroom/bathroom Home Equipment: None Additional Comments: daughter lives close by, family can assist as needed    Prior Function Prior Level of Function : Independent/Modified Independent;Working/employed;Driving             Mobility Comments: 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, technical sales, drives, mows yard       Higher education careers adviser   Dominant Hand: Right    Extremity/Trunk Assessment   Upper Extremity Assessment Upper Extremity Assessment: Defer to OT evaluation    Lower Extremity Assessment Lower Extremity Assessment: LLE deficits/detail LLE Deficits / Details: hip flex 3+/5, knee ext 4-/5, knee flex 4-/5, decreased coordination LLE distal > Proximal LLE Sensation: WNL LLE Coordination: decreased gross motor    Cervical / Trunk Assessment Cervical / Trunk Assessment: Normal  Communication   Communication: Expressive difficulties (mild)  Cognition Arousal/Alertness: Awake/alert Behavior During Therapy: WFL for tasks assessed/performed Overall Cognitive Status: Impaired/Different from baseline Area of Impairment: Problem solving                              Problem Solving: Slow processing General Comments: mildly slow processing and increased reaction time L side        General Comments General comments (skin integrity, edema, etc.): daughter present throughout and instructed that pt needs increased supervision in the short term. Educated family on brain rest and the need to follow MD instructions in re: meds as pt quite averse to medications.    Exercises     Assessment/Plan    PT Assessment Patient needs continued PT services  PT Problem List Decreased balance;Decreased mobility;Decreased coordination;Decreased safety awareness;Decreased knowledge of precautions;Decreased knowledge of use of DME       PT Treatment Interventions DME instruction;Gait training;Stair training;Functional mobility training;Therapeutic activities;Therapeutic exercise;Balance training;Patient/family education    PT Goals (Current goals can be found in the Care Plan section)  Acute Rehab PT Goals Patient Stated Goal: return home PT Goal Formulation: With patient Time For Goal Achievement: 05/05/23 Potential to Achieve Goals: Good    Frequency Min 4X/week     Co-evaluation               AM-PAC PT "6 Clicks" Mobility  Outcome Measure Help needed turning from your back to your side while in a flat bed without using bedrails?: None Help needed moving from lying on your back to sitting on the side of a flat bed without using bedrails?: None Help needed moving  to and from a bed to a chair (including a wheelchair)?: A Little Help needed standing up from a chair using your arms (e.g., wheelchair or bedside chair)?: A Little Help needed to walk in hospital room?: A Little Help needed climbing 3-5 steps with a railing? : A Little 6 Click Score: 20    End of Session Equipment Utilized During Treatment: Gait belt Activity Tolerance: Patient tolerated treatment well Patient left: in chair;with call bell/phone within  reach;with chair alarm set;with family/visitor present Nurse Communication: Mobility status;Other (comment) (no call bell cord for chair alarm) PT Visit Diagnosis: Unsteadiness on feet (R26.81);Ataxic gait (R26.0)    Time: 1047-1130 PT Time Calculation (min) (ACUTE ONLY): 43 min   Charges:   PT Evaluation $PT Eval Moderate Complexity: 1 Mod PT Treatments $Gait Training: 23-37 mins        Lyanne Co, PT  Acute Rehab Services Secure chat preferred Office [phone]   Lawana Chambers Lyndell Allaire 04/21/2023, 12:45 PM

## 2023-04-21 NOTE — Progress Notes (Signed)
Discharge instructions provided to patient. IV out. Monitor off CCMD notified. Discharging to home with family.

## 2023-04-21 NOTE — TOC Transition Note (Signed)
Transition of Care [facility]) - CM/SW Discharge Note   Patient Details  Name: Connie Bell MRN: [id number] Date of Birth: Apr 30, 1941  Transition of Care [facility]) CM/SW Contact:  Kermit Balo, RN Phone Number: 04/21/2023, 3:43 PM   Clinical Narrative:    Pt is discharging home with outpatient therapy through [facility]. Information on the AVS. Pt will need to call and schedule the first appointment.  Walker for home ordered through [employer] and will be delivered to the room. Pt lives at home alone but family is planning to stay with her after d/c.  Pt wasn't taking any prescription medications prior to admission. She feels she can manage medications after d/c.  Pt drives self as needed. Family to assist after d/c and will transport home.   Final next level of care: OP Rehab Barriers to Discharge: No Barriers Identified   Patient Goals and CMS Choice CMS Medicare.gov Compare Post Acute Care list provided to:: Patient Choice offered to / list presented to : Patient  Discharge Placement                         Discharge Plan and Services Additional resources added to the After Visit Summary for                  DME Arranged: Walker rolling DME Agency: [employer] Date DME Agency Contacted: 04/21/23   Representative spoke with at DME Agency: Marthann Schiller            Social Determinants of Health (SDOH) Interventions SDOH Screenings   Food Insecurity: No Food Insecurity (04/21/2023)  Housing: Low Risk  (04/21/2023)  Transportation Needs: No Transportation Needs (04/21/2023)  Utilities: Not At Risk (04/21/2023)  Depression (PHQ2-9): Low Risk  (07/19/2019)  Financial Resource Strain: Low Risk  (07/19/2019)  Physical Activity: Sufficiently Active (07/19/2019)  Stress: No Stress Concern Present (07/19/2019)  Tobacco Use: Low Risk  (04/20/2023)     Readmission Risk Interventions     No data to display

## 2023-04-21 NOTE — Discharge Summary (Signed)
Physician Discharge Summary  Connie Bell OZH:[id number] DOB: [birth year] DOA: 04/20/2023  PCP: [name], MD  Admit date: 04/20/2023 Discharge date: 04/21/2023  Admitted From: Home  Discharge disposition: Home with OP PT   Recommendations for Outpatient Follow-Up:   Follow up with your primary care provider in one week.  Check CBC, BMP, magnesium in the next visit Follow-up with [facility] Associates in 4 to 6 weeks internal referral has been made.   Discharge Diagnosis:   Principal Problem:   TIA (transient ischemic attack) Active Problems:   Hyperlipemia   Depression   Essential hypertension   Vertigo   Discharge Condition: Improved.  Diet recommendation: Low sodium, heart healthy.   Wound care: None.  Code status: Full.   History of Present Illness:   Patient is 82 years old female with past medical history of depression, hyperlipidemia, hypertension not on medications presented to the hospital with left-sided weakness and slurred speech noted this morning at 7.  Patient stated that she had gone to bed yesterday with some headache and had some little dizziness but when she woke up in the morning had trouble with speech, had gait imbalance and had a fall.  Patient did report to her family later on in the day and was brought into the hospital.  Patient denies any nausea, vomiting, diarrhea, fever, chills, cough, congestion or chest pain.  Denies any urinary urgency, frequency or dysuria.  Denies any recent travel or sick contacts.  Patient is independent at baseline and is tutored by precaution.  As per the family patient is mostly healthy and does not like to take medications.  She has had recent PCP follow-up and her blood pressure was normal at that time.  She has been taking over-the-counter medications for vertigo.   In the ED, patient was noted to have blood pressure elevated at 184/86.  Urine drug screen was negative.  Chemistry was negative including  LFTs.  Urine analysis was negative.  CBC within normal range.  CT head scan did not show any acute findings but mild chronic small vessel disease.  EKG in the ED was concerning for possible rate controlled atrial flutter.  Subsequent telemetry rhythm showed normal sinus rhythm.  Neurology was consulted from the ED and patient was considered for admission to the hospital for further workup.   Hospital Course:   Following conditions were addressed during hospitalization as listed below,  Right pontine stroke likely from small vessel disease  Patient had improvement neurological signs of during hospitalization.  Seen by PT OT and recommend outpatient PT evaluation.  Patient underwent MRI of the brain CTA of the head and neck.  Patient was seen by neurology and impression is right pontine stroke likely secondary to small vessel disease.  Neurology has recommended aspirin and Plavix for 21 days followed by aspirin alone with initiation of Crestor 20 mg daily.  2D echocardiogram showed preserved LV function.  History of hypertension.  Previous visit without high blood pressure as per the patient.  Accelerated when she came in.  Improved prior to discharge.  Patient will need to follow-up with her primary care provider within 1 week to assess for blood pressure and get treated if necessary.  Patient stated that her blood pressure was normal recently when she had visited her   Hyperlipidemia as per the chart.  Lipid profile noted with the total cholesterol of 230 and LDL of 131.   Mention of Lipitor with unknown side effects in the computer.  Unclear. Discussed with neurology and patient has been initiated on Crestor 20mg  by neurology.  Will continue after discharge.  Disposition.  At this time, patient is stable for disposition home with outpatient PT follow-up.  Medical Consultants:  Neurology  Procedures:    Stroke workup Subjective:   Today, patient was seen and examined at bedside.  Patient's  family at bedside.  Denies any dizziness lightheadedness shortness of breath.  Ambulated with physical therapy.  Facial droop mildly present but overall improved.  Discharge Exam:   Vitals:   04/21/23 0738 04/21/23 1452  BP: (!) 140/69 (!) 145/74  Pulse: 61 65  Resp: 18 18  Temp: 98.3 F (36.8 C) 97.6 F (36.4 C)  SpO2: 96% 96%   Vitals:   04/20/23 2337 04/21/23 0400 04/21/23 0738 04/21/23 1452  BP: (!) 174/74 129/71 (!) 140/69 (!) 145/74  Pulse: (!) 59 64 61 65  Resp: 16 17 18 18   Temp: 99.3 F (37.4 C) 97.8 F (36.6 C) 98.3 F (36.8 C) 97.6 F (36.4 C)  TempSrc: Oral Oral Oral Oral  SpO2: 99% 96% 96% 96%  Weight:      Height:       Body mass index is 26.57 kg/m.  General: Alert awake, not in obvious distress, Communicative, HENT: pupils equally reacting to light,  No scleral pallor or icterus noted. Oral mucosa is moist.  Chest:  Clear breath sounds.  Diminished breath sounds bilaterally. No crackles or wheezes.  CVS: S1 &S2 heard. No murmur.  Regular rate and rhythm. Abdomen: Soft, nontender, nondistended.  Bowel sounds are heard.   Extremities: No cyanosis, clubbing or edema.  Peripheral pulses are palpable. Psych: Alert, awake and oriented, normal mood CNS: Mild facial droop, minimal left sided weakness. Skin: Warm and dry.  No rashes noted.  The results of significant diagnostics from this hospitalization (including imaging, microbiology, ancillary and laboratory) are listed below for reference.     Diagnostic Studies:   ECHOCARDIOGRAM COMPLETE  Result Date: 04/21/2023    ECHOCARDIOGRAM REPORT   Patient Name:   Connie Bell Date of Exam: 04/21/2023 Medical Rec #:  [id number]      Height:       63.0 in Accession #:    [id number]     Weight:       150.0 lb Date of Birth:  Jan 31, 1941     BSA:          1.711 m Patient Age:    81 years       BP:           140/69 mmHg Patient Gender: F              HR:           62 bpm. Exam Location:  Inpatient Procedure: 2D Echo,  Color Doppler, Cardiac Doppler and 3D Echo Indications:    TIA G45.9  History:        Patient has no prior history of Echocardiogram examinations.                 TIA; Risk Factors:Hypertension, Dyslipidemia and Non-Smoker.  Sonographer:    Aron Baba Referring Phys: [id number] LKGMWN Aero Drummonds IMPRESSIONS  1. Left ventricular ejection fraction, by estimation, is 55 to 60%. The left ventricle has normal function. The left ventricle has no regional wall motion abnormalities. Left ventricular diastolic parameters were normal.  2. Right ventricular systolic function is normal. The right ventricular size is normal. There is normal pulmonary artery systolic  pressure.  3. Left atrial size was moderately dilated.  4. Right atrial size was mildly dilated.  5. The mitral valve is normal in structure. No evidence of mitral valve regurgitation. No evidence of mitral stenosis.  6. The aortic valve is tricuspid. There is mild calcification of the aortic valve. Aortic valve regurgitation is not visualized. Aortic valve sclerosis/calcification is present, without any evidence of aortic stenosis.  7. The inferior vena cava is dilated in size with >50% respiratory variability, suggesting right atrial pressure of 8 mmHg. FINDINGS  Left Ventricle: Left ventricular ejection fraction, by estimation, is 55 to 60%. The left ventricle has normal function. The left ventricle has no regional wall motion abnormalities. The left ventricular internal cavity size was normal in size. There is  no left ventricular hypertrophy. Left ventricular diastolic parameters were normal. Right Ventricle: The right ventricular size is normal. No increase in right ventricular wall thickness. Right ventricular systolic function is normal. There is normal pulmonary artery systolic pressure. The tricuspid regurgitant velocity is 2.11 m/s, and  with an assumed right atrial pressure of 8 mmHg, the estimated right ventricular systolic pressure is 25.8 mmHg. Left  Atrium: Left atrial size was moderately dilated. Right Atrium: Right atrial size was mildly dilated. Pericardium: There is no evidence of pericardial effusion. Mitral Valve: The mitral valve is normal in structure. No evidence of mitral valve regurgitation. No evidence of mitral valve stenosis. Tricuspid Valve: The tricuspid valve is normal in structure. Tricuspid valve regurgitation is not demonstrated. No evidence of tricuspid stenosis. Aortic Valve: The aortic valve is tricuspid. There is mild calcification of the aortic valve. Aortic valve regurgitation is not visualized. Aortic valve sclerosis/calcification is present, without any evidence of aortic stenosis. Pulmonic Valve: The pulmonic valve was normal in structure. Pulmonic valve regurgitation is not visualized. No evidence of pulmonic stenosis. Aorta: The aortic root is normal in size and structure. Venous: The inferior vena cava is dilated in size with greater than 50% respiratory variability, suggesting right atrial pressure of 8 mmHg. IAS/Shunts: No atrial level shunt detected by color flow Doppler.  LEFT VENTRICLE PLAX 2D LVIDd:         5.00 cm   Diastology LVIDs:         3.60 cm   LV e' medial:    11.40 cm/s LV PW:         0.80 cm   LV E/e' medial:  7.9 LV IVS:        0.70 cm   LV e' lateral:   8.27 cm/s LVOT diam:     1.50 cm   LV E/e' lateral: 10.9 LV SV:         38 LV SV Index:   22 LVOT Area:     1.77 cm                           3D Volume EF:                          3D EF:        54 %                          LV EDV:       115 ml  LV ESV:       52 ml                          LV SV:        62 ml RIGHT VENTRICLE RV S prime:     12.20 cm/s TAPSE (M-mode): 1.6 cm LEFT ATRIUM             Index        RIGHT ATRIUM           Index LA diam:        4.90 cm 2.86 cm/m   RA Area:     15.60 cm LA Vol (A2C):   81.0 ml 47.34 ml/m  RA Volume:   37.80 ml  22.09 ml/m LA Vol (A4C):   52.0 ml 30.39 ml/m LA Biplane Vol: 66.0 ml 38.57  ml/m  AORTIC VALVE LVOT Vmax:   87.50 cm/s LVOT Vmean:  58.300 cm/s LVOT VTI:    0.216 m  AORTA Ao Root diam: 3.10 cm Ao Asc diam:  2.90 cm MITRAL VALVE               TRICUSPID VALVE MV Area (PHT): 2.83 cm    TR Peak grad:   17.8 mmHg MV Decel Time: 268 msec    TR Vmax:        211.00 cm/s MV E velocity: 89.80 cm/s MV A velocity: 76.40 cm/s  SHUNTS MV E/A ratio:  1.18        Systemic VTI:  0.22 m                            Systemic Diam: 1.50 cm Arvilla Meres MD Electronically signed by Arvilla Meres MD Signature Date/Time: 04/21/2023/11:10:31 AM    Final    CT ANGIO HEAD NECK W WO CM  Result Date: 04/20/2023 CLINICAL DATA:  Transient ischemic attack EXAM: CT ANGIOGRAPHY HEAD AND NECK WITH AND WITHOUT CONTRAST TECHNIQUE: Multidetector CT imaging of the head and neck was performed using the standard protocol during bolus administration of intravenous contrast. Multiplanar CT image reconstructions and MIPs were obtained to evaluate the vascular anatomy. Carotid stenosis measurements (when applicable) are obtained utilizing NASCET criteria, using the distal internal carotid diameter as the denominator. RADIATION DOSE REDUCTION: This exam was performed according to the departmental dose-optimization program which includes automated exposure control, adjustment of the mA and/or kV according to patient size and/or use of iterative reconstruction technique. CONTRAST:  75mL OMNIPAQUE IOHEXOL 350 MG/ML SOLN COMPARISON:  None Available. FINDINGS: CTA NECK FINDINGS SKELETON: There is no bony spinal canal stenosis. No lytic or blastic lesion. OTHER NECK: Normal pharynx, larynx and major salivary glands. No cervical lymphadenopathy. Unremarkable thyroid gland. UPPER CHEST: No pneumothorax or pleural effusion. No nodules or masses. AORTIC ARCH: There is calcific atherosclerosis of the aortic arch. There is no aneurysm, dissection or hemodynamically significant stenosis of the visualized portion of the aorta. Conventional  3 vessel aortic branching pattern. The visualized proximal subclavian arteries are widely patent. RIGHT CAROTID SYSTEM: No dissection, occlusion or aneurysm. Mild atherosclerotic calcification at the carotid bifurcation without hemodynamically significant stenosis. LEFT CAROTID SYSTEM: Normal without aneurysm, dissection or stenosis. VERTEBRAL ARTERIES: Left dominant configuration. Both origins are clearly patent. There is no dissection, occlusion or flow-limiting stenosis to the skull base (V1-V3 segments). CTA HEAD FINDINGS POSTERIOR CIRCULATION: --Vertebral arteries: Atherosclerotic calcification of the right V4 segment without hemodynamically significant  stenosis. --Inferior cerebellar arteries: Normal. --Basilar artery: Mild multifocal narrowing --Superior cerebellar arteries: Normal. --Posterior cerebral arteries (PCA): Moderate narrowing of the right proximal P2 segment. Normal left. ANTERIOR CIRCULATION: --Intracranial internal carotid arteries: Normal. --Anterior cerebral arteries (ACA): Normal. Both A1 segments are present. Patent anterior communicating artery (a-comm). --Middle cerebral arteries (MCA): Normal. VENOUS SINUSES: As permitted by contrast timing, patent. ANATOMIC VARIANTS: Fetal origin of the right posterior cerebral artery. Review of the MIP images confirms the above findings. IMPRESSION: 1. No emergent large vessel occlusion. 2. Moderate narrowing of the right posterior cerebral artery proximal P2 segment. 3. Mild atherosclerotic calcification of the right carotid bifurcation and right V4 segment without hemodynamically significant stenosis. Aortic atherosclerosis (ICD10-I70.0). Electronically Signed   By: Deatra Robinson M.D.   On: 04/20/2023 22:59   MR BRAIN WO CONTRAST  Result Date: 04/20/2023 CLINICAL DATA:  Transient ischemic attack EXAM: MRI HEAD WITHOUT CONTRAST TECHNIQUE: Multiplanar, multiecho pulse sequences of the brain and surrounding structures were obtained without intravenous  contrast. COMPARISON:  08/25/2014 FINDINGS: Brain: No acute infarct, mass effect or extra-axial collection. No acute or chronic hemorrhage. There is multifocal hyperintense T2-weighted signal within the white matter. Parenchymal volume and CSF spaces are normal. The midline structures are normal. Vascular: Major flow voids are preserved. Skull and upper cervical spine: Normal calvarium and skull base. Visualized upper cervical spine and soft tissues are normal. Sinuses/Orbits:No paranasal sinus fluid levels or advanced mucosal thickening. No mastoid or middle ear effusion. Normal orbits. IMPRESSION: 1. No acute intracranial abnormality. 2. Findings of chronic small vessel ischemia. Electronically Signed   By: Deatra Robinson M.D.   On: 04/20/2023 22:22   CT HEAD WO CONTRAST  Result Date: 04/20/2023 CLINICAL DATA:  Neuro deficit, acute, stroke suspected. EXAM: CT HEAD WITHOUT CONTRAST TECHNIQUE: Contiguous axial images were obtained from the base of the skull through the vertex without intravenous contrast. RADIATION DOSE REDUCTION: This exam was performed according to the departmental dose-optimization program which includes automated exposure control, adjustment of the mA and/or kV according to patient size and/or use of iterative reconstruction technique. COMPARISON:  Head CT 08/20/2014.  MRI brain 08/25/2014. FINDINGS: Brain: No acute intracranial hemorrhage. Unchanged mild chronic small-vessel disease. Gray-white differentiation is otherwise preserved. No hydrocephalus or extra-axial collection. No mass effect or midline shift. Vascular: No hyperdense vessel or unexpected calcification. Skull: No calvarial fracture or suspicious bone lesion. Skull base is unremarkable. Sinuses/Orbits: Unremarkable. Other: None. IMPRESSION: 1. No acute intracranial abnormality. 2. Unchanged mild chronic small-vessel disease. Electronically Signed   By: Orvan Falconer M.D.   On: 04/20/2023 16:10     Labs:   Basic Metabolic  Panel: Recent Labs  Lab 04/20/23 1548 04/20/23 1554  NA 139 141  K 3.7 3.7  CL 105 104  CO2 25  --   GLUCOSE 95 89  BUN 10 11  CREATININE 0.79 0.80  CALCIUM 9.3  --    GFR Estimated Creatinine Clearance: 51 mL/min (by C-G formula based on SCr of 0.8 mg/dL). Liver Function Tests: Recent Labs  Lab 04/20/23 1548  AST 17  ALT 20  ALKPHOS 75  BILITOT 0.8  PROT 7.7  ALBUMIN 4.0   No results for input(s): "LIPASE", "AMYLASE" in the last 168 hours. No results for input(s): "AMMONIA" in the last 168 hours. Coagulation profile Recent Labs  Lab 04/20/23 1548  INR 1.0    CBC: Recent Labs  Lab 04/20/23 1548 04/20/23 1554  WBC 5.4  --   NEUTROABS 2.8  --  HGB 13.3 13.6  HCT 40.4 40.0  MCV 93.7  --   PLT 347  --    Cardiac Enzymes: No results for input(s): "CKTOTAL", "CKMB", "CKMBINDEX", "TROPONINI" in the last 168 hours. BNP: Invalid input(s): "POCBNP" CBG: No results for input(s): "GLUCAP" in the last 168 hours. D-Dimer No results for input(s): "DDIMER" in the last 72 hours. Hgb A1c No results for input(s): "HGBA1C" in the last 72 hours. Lipid Profile Recent Labs    04/21/23 0637  CHOL 230*  HDL 41  LDLCALC 131*  TRIG 290*  CHOLHDL 5.6   Thyroid function studies Recent Labs    04/20/23 1548  TSH 3.045   Anemia work up No results for input(s): "VITAMINB12", "FOLATE", "FERRITIN", "TIBC", "IRON", "RETICCTPCT" in the last 72 hours. Microbiology No results found for this or any previous visit (from the past 240 hour(s)).   Discharge Instructions:   Discharge Instructions     Ambulatory referral to Neurology   Complete by: As directed    An appointment is requested in approximately: 4 weeks   Ambulatory referral to Occupational Therapy   Complete by: As directed    Ambulatory referral to Physical Therapy   Complete by: As directed    Diet - low sodium heart healthy   Complete by: As directed    Discharge instructions   Complete by: As  directed    Follow-up with your primary care provider in 1 week.  Follow-up with [facility] in 4 to 6 weeks.  Take aspirin and Plavix for 21 days followed by aspirin.  Seek medical attention for worsening symptoms.   Increase activity slowly   Complete by: As directed       Allergies as of 04/21/2023       Reactions   Alendronate Sodium    REACTION: depressed, headaches, stomach aches   Morphine    REACTION: /whelts   Atorvastatin    REACTION: pt does not remember        Medication List     TAKE these medications    aspirin EC 81 MG tablet Take 1 tablet (81 mg total) by mouth daily. Swallow whole.   clopidogrel 75 MG tablet Commonly known as: PLAVIX Take 1 tablet (75 mg total) by mouth daily for 21 days. Start taking on: April 22, 2023   multivitamin with minerals Tabs tablet Take 1 tablet by mouth daily.   rosuvastatin 20 MG tablet Commonly known as: CRESTOR Take 1 tablet (20 mg total) by mouth daily. Start taking on: April 22, 2023   Vitamin D3 25 MCG (1000 UT) Caps Take 1 capsule by mouth daily.               Durable Medical Equipment  (From admission, onward)           Start     Ordered   04/21/23 1540  For home use only DME Walker rolling  Once       Question Answer Comment  Walker: With 5 Inch Wheels   Patient needs a walker to treat with the following condition Stroke [facility])      04/21/23 1540            Follow-up Information     [facility]. Schedule an appointment as soon as possible for a visit.   Specialty: Rehabilitation Contact information: [address] [id number] mc [address] [place] [address] [phone]        [name], MD Follow up in  1 week(s).   Specialties: Family Medicine, Radiology Contact information: [address] [place] [address] [phone]                  Time coordinating discharge: 39  minutes  Signed:  Alcides Nutting  [facility] 04/21/2023, 4:34 PM

## 2023-04-22 LAB — HEMOGLOBIN A1C
Hgb A1c MFr Bld: 6.5 % — ABNORMAL HIGH (ref 4.8–5.6)
Mean Plasma Glucose: 140 mg/dL

## 2023-04-28 ENCOUNTER — Ambulatory Visit: Payer: Medicare Other | Attending: Internal Medicine | Admitting: Physical Therapy

## 2023-04-28 ENCOUNTER — Encounter: Payer: Self-pay | Admitting: Occupational Therapy

## 2023-04-28 ENCOUNTER — Ambulatory Visit: Payer: Medicare Other | Admitting: Occupational Therapy

## 2023-04-28 VITALS — BP 153/75 | HR 68

## 2023-04-28 DIAGNOSIS — M6281 Muscle weakness (generalized): Secondary | ICD-10-CM

## 2023-04-28 DIAGNOSIS — R2681 Unsteadiness on feet: Secondary | ICD-10-CM | POA: Insufficient documentation

## 2023-04-28 DIAGNOSIS — I69354 Hemiplegia and hemiparesis following cerebral infarction affecting left non-dominant side: Secondary | ICD-10-CM | POA: Diagnosis not present

## 2023-04-28 DIAGNOSIS — R2689 Other abnormalities of gait and mobility: Secondary | ICD-10-CM | POA: Insufficient documentation

## 2023-04-28 DIAGNOSIS — I639 Cerebral infarction, unspecified: Secondary | ICD-10-CM | POA: Diagnosis not present

## 2023-04-28 NOTE — Therapy (Signed)
OUTPATIENT PHYSICAL THERAPY NEURO EVALUATION   Patient Name: Connie Bell MRN: [id number] DOB:10-16-1941, 82 y.o., female Today's Date: 04/28/2023   PCP: [name], MD REFERRING PROVIDER: Joycelyn Das, MD  END OF SESSION:  PT End of Session - 04/28/23 1102     Visit Number 1    Number of Visits 7   Plus eval   Date for PT Re-Evaluation 06/09/23    Authorization Type [employer] Medicare    PT Start Time 1101    PT Stop Time 1142    PT Time Calculation (min) 41 min    Equipment Utilized During Treatment Gait belt    Activity Tolerance Patient tolerated treatment well    Behavior During Therapy WFL for tasks assessed/performed             Past Medical History:  Diagnosis Date   COVID-19    Depression    Hyperlipemia    borderline not on Rx.   Osteoporosis    Rosacea    Skin cancer    on nose and neck 2 times/basal cell   Past Surgical History:  Procedure Laterality Date   COLONOSCOPY     HEMORRHOID SURGERY     Pt passed out past surgery due to pain!   PARTIAL HYSTERECTOMY     secondary to abn pap   Patient refuses     mammograms, chol treatment and vaccines   POLYPECTOMY     Patient Active Problem List   Diagnosis Date Noted   TIA (transient ischemic attack) 04/20/2023   Vertigo 04/20/2023   Hearing loss 02/18/2023   History of COVID-19 11/24/2019   Type 2 diabetes mellitus without complication, without long-term current use of insulin (HCC) 07/26/2019   Essential hypertension 11/23/2018   Routine general medical examination at a health care facility 07/07/2017   Heme positive stool 08/08/2016   Colon cancer screening 07/14/2016   Family history of colon cancer 07/14/2016   Pre-employment examination 08/26/2013   Hemorrhoids 10/28/2011   HEMORRHOIDS, INTERNAL 01/08/2011   Hyperlipemia 01/12/2008   Depression 01/12/2008   ROSACEA 01/12/2008    ONSET DATE: 04/21/2023 (referral)   REFERRING DIAG: I63.9 (ICD-10-CM) - Cerebrovascular accident  (CVA), unspecified mechanism (HCC)  THERAPY DIAG:  Unsteadiness on feet  Hemiplegia and hemiparesis following cerebral infarction affecting left non-dominant side (HCC)  Other abnormalities of gait and mobility  Rationale for Evaluation and Treatment: Rehabilitation  SUBJECTIVE:  SUBJECTIVE STATEMENT: Pt presents with daughter, using SPC. Pt reports she has had 2 falls, one when she had her stroke and one last week while trying to put on her pants while standing. Was unable to get up on her own. Pt reports she is very independent and does not want to use the cane. Hardest task is bathing, daughter is getting a shower chair to make task more efficient.    Pt accompanied by:  Daughter, Devonne Doughty  PERTINENT HISTORY: Per OT, pt has history of vertigo. CVA on 04/20/23  PAIN:  Are you having pain? No  PRECAUTIONS: Fall  WEIGHT BEARING RESTRICTIONS: No  FALLS: Has patient fallen in last 6 months? Yes. Number of falls 2  LIVING ENVIRONMENT: Lives with: lives alone Lives in: House/apartment Stairs: Yes: Internal: 1 steps; none and External: 3 steps; on right going up Has following equipment at home: Single point cane, Walker - 2 wheeled, and plans to get shower chair  PLOF: Independent  PATIENT GOALS: "To get better and I wanna drive"   OBJECTIVE:   DIAGNOSTIC FINDINGS: MRI of head on 04/20/23  IMPRESSION: 1. No acute intracranial abnormality. 2. Findings of chronic small vessel ischemia.  COGNITION: Overall cognitive status: Within functional limits for tasks assessed   SENSATION: Pt reports tingling in her LLE and LUE, more so at night    POSTURE: rounded shoulders and forward head   LOWER EXTREMITY MMT:  Tested in seated position   MMT Right Eval Left Eval  Hip flexion 5 4  Hip  extension    Hip abduction 5 5  Hip adduction 5 5  Hip internal rotation    Hip external rotation    Knee flexion 5 4+  Knee extension 5 4+  Ankle dorsiflexion 5 5  Ankle plantarflexion    Ankle inversion    Ankle eversion    (Blank rows = not tested)  BED MOBILITY:  Independent per pt   TRANSFERS: Assistive device utilized: None  Sit to stand: Modified independence Stand to sit: Modified independence  STAIRS: Level of Assistance: Modified independence and SBA Stair Negotiation Technique: Alternating Pattern  with No Rails Bilateral Rails Number of Stairs: 8  Height of Stairs: 6"  Comments: Pt navigated 4 stairs w/bilateral rails and 4 steps without rails. Pt mod I w/rails but requires SBA w/o rails due to poor foot placement on step and decreased eccentric control   GAIT: Gait pattern: step through pattern, decreased arm swing- Left, decreased stride length, decreased hip/knee flexion- Left, and poor foot clearance- Left Distance walked: various clinic distances  Assistive device utilized: Single point cane and None Level of assistance: SBA and single instance of min A due to tripping over L foot Comments: Pt ambulated into/out of clinic w/SPC and throughout eval without cane. Pt demonstrates minor lateral gait deviations to L side without cane and had single anterior LOB incident at end of eval, requiring min A to prevent fall.   FUNCTIONAL TESTS:   [facility] PT Assessment - 04/28/23 1118       Transfers   Five time sit to stand comments  17.75s   no UE support     Ambulation/Gait   Gait velocity 32.8' over 13.28s w/SPC = 2.5 ft/s   32.8' over 11.03s no AD = 2.97 ft/s     Functional Gait  Assessment   Gait assessed  Yes    Gait Level Surface Walks 20 ft, slow speed, abnormal gait pattern, evidence for imbalance or deviates 10-15 in  outside of the 12 in walkway width. Requires more than 7 sec to ambulate 20 ft.   7.63s   Change in Gait Speed Makes only minor adjustments  to walking speed, or accomplishes a change in speed with significant gait deviations, deviates 10-15 in outside the 12 in walkway width, or changes speed but loses balance but is able to recover and continue walking.    Gait with Horizontal Head Turns Performs head turns with moderate changes in gait velocity, slows down, deviates 10-15 in outside 12 in walkway width but recovers, can continue to walk.   LOB to R side w/R head turn   Gait with Vertical Head Turns Performs task with moderate change in gait velocity, slows down, deviates 10-15 in outside 12 in walkway width but recovers, can continue to walk.    Gait and Pivot Turn Pivot turns safely in greater than 3 sec and stops with no loss of balance, or pivot turns safely within 3 sec and stops with mild imbalance, requires small steps to catch balance.    Step Over Obstacle Is able to step over one shoe box (4.5 in total height) but must slow down and adjust steps to clear box safely. May require verbal cueing.    Gait with Narrow Base of Support Ambulates less than 4 steps heel to toe or cannot perform without assistance.    Gait with Eyes Closed Walks 20 ft, uses assistive device, slower speed, mild gait deviations, deviates 6-10 in outside 12 in walkway width. Ambulates 20 ft in less than 9 sec but greater than 7 sec.   10.88s   Ambulating Backwards Walks 20 ft, slow speed, abnormal gait pattern, evidence for imbalance, deviates 10-15 in outside 12 in walkway width.   22.47s   Steps Alternating feet, must use rail.    Total Score 12    FGA comment: high fall risk              VITALS  Vitals:   04/28/23 1120  BP: (!) 153/75  Pulse: 68     TODAY'S TREATMENT:           Next Session                                                                                                                         PATIENT EDUCATION: Education details: POC, eval findings, educated pt on need for cane in community and when fatigued but to try  to walk in her house without AD and without furniture walking. Discussing return to driving at next MD appointment Person educated: Patient and Child(ren) Education method: Explanation Education comprehension: verbalized understanding  HOME EXERCISE PROGRAM: To be established  GOALS: Goals reviewed with patient? Yes  SHORT TERM GOALS: Target date: 05/26/2023   Pt will be independent with initial HEP for improved strength, balance, transfers and gait.  Baseline: not established on eval Goal status: INITIAL  2.  Floor transfer to be assessed and STG/LTG updated as  appropriate  Baseline:  Goal status: INITIAL  3.  Pt will improve FGA to 16/30 for decreased fall risk   Baseline: 12/30 Goal status: INITIAL   LONG TERM GOALS: Target date: 06/09/2023 '  Pt will be independent with final HEP for improved strength, balance, transfers and gait.  Baseline:  Goal status: INITIAL  2.  Pt will improve FGA to 21/30 for decreased fall risk   Baseline: 12/30 Goal status: INITIAL  3.  Pt will improve gait velocity to at least 3.2 ft/s w/LRAD for improved gait efficiency and independence  Baseline: 2.97 ft/s no AD, 2.5 ft/s w/SPC Goal status: INITIAL  4.  Floor transfer goal  Baseline:  Goal status: INITIAL    ASSESSMENT:  CLINICAL IMPRESSION: Patient is a 82 year old female referred to Neuro OPPT for CVA.   Pt's PMH is significant for: depression, hyperlipidemia, hypertension. The following deficits were present during the exam: impaired balance, decreased LLE strength and impaired sensation of L hemibody. Based on falls history and FGA, pt is a high fall risk. Pt would benefit from skilled PT to address these impairments and functional limitations to maximize functional mobility independence.    OBJECTIVE IMPAIRMENTS: Abnormal gait, decreased balance, decreased knowledge of use of DME, decreased mobility, difficulty walking, decreased strength, and impaired  sensation  ACTIVITY LIMITATIONS: carrying, hygiene/grooming, and locomotion level  PARTICIPATION LIMITATIONS: meal prep, cleaning, laundry, driving, shopping, community activity, and yard work  PERSONAL FACTORS: Age, Fitness, and 1 comorbidity: HTN  are also affecting patient's functional outcome.   REHAB POTENTIAL: Good  CLINICAL DECISION MAKING: Evolving/moderate complexity  EVALUATION COMPLEXITY: Moderate  PLAN:  PT FREQUENCY: 1x/week  PT DURATION: 6 weeks  PLANNED INTERVENTIONS: Therapeutic exercises, Therapeutic activity, Neuromuscular re-education, Balance training, Gait training, Patient/Family education, Self Care, Joint mobilization, Stair training, Vestibular training, Canalith repositioning, DME instructions, Aquatic Therapy, Electrical stimulation, Manual therapy, and Re-evaluation  PLAN FOR NEXT SESSION: Floor transfer and update goal, HEP - standing w/EC and head turns, fwd/retro tandem gait, monster walks, seated march overs. Scifit for endurance, monitor BP   Veron Senner E Genevra Orne, PT, DPT 04/28/2023, 11:47 AM

## 2023-04-28 NOTE — Therapy (Signed)
OUTPATIENT OCCUPATIONAL THERAPY NEURO EVALUATION  Patient Name: Connie Bell MRN: [id number] DOB:[birth year], 82 y.o., female Today's Date: 04/28/2023  PCP: [name], MD  REFERRING PROVIDER: Joycelyn Das, MD   END OF SESSION:  OT End of Session - 04/28/23 1025     Visit Number 1    Number of Visits 4    Date for OT Re-Evaluation 05/22/23    Authorization Type [employer] Medicare    Progress Note Due on Visit 4    OT Start Time 1024    OT Stop Time 1100    OT Time Calculation (min) 36 min    Activity Tolerance Patient tolerated treatment well    Behavior During Therapy WFL for tasks assessed/performed             Past Medical History:  Diagnosis Date   COVID-19    Depression    Hyperlipemia    borderline not on Rx.   Osteoporosis    Rosacea    Skin cancer    on nose and neck 2 times/basal cell   Past Surgical History:  Procedure Laterality Date   COLONOSCOPY     HEMORRHOID SURGERY     Pt passed out past surgery due to pain!   PARTIAL HYSTERECTOMY     secondary to abn pap   Patient refuses     mammograms, chol treatment and vaccines   POLYPECTOMY     Patient Active Problem List   Diagnosis Date Noted   TIA (transient ischemic attack) 04/20/2023   Vertigo 04/20/2023   Hearing loss 02/18/2023   History of COVID-19 11/24/2019   Type 2 diabetes mellitus without complication, without long-term current use of insulin (HCC) 07/26/2019   Essential hypertension 11/23/2018   Routine general medical examination at a health care facility 07/07/2017   Heme positive stool 08/08/2016   Colon cancer screening 07/14/2016   Family history of colon cancer 07/14/2016   Pre-employment examination 08/26/2013   Hemorrhoids 10/28/2011   HEMORRHOIDS, INTERNAL 01/08/2011   Hyperlipemia 01/12/2008   Depression 01/12/2008   ROSACEA 01/12/2008    ONSET DATE: 04/21/2023 (referral)   REFERRING DIAG: I63.9 (ICD-10-CM) - Cerebrovascular accident (CVA), unspecified mechanism    THERAPY DIAG:  Muscle weakness (generalized)  Hemiplegia and hemiparesis following cerebral infarction affecting left non-dominant side (HCC)  Rationale for Evaluation and Treatment: Rehabilitation  SUBJECTIVE:   SUBJECTIVE STATEMENT: She has had significant improvement from when she was in the hospital.  She was told she should use a RW but it does not fit in her home well. She would like to go without AD for ambulation all together. History of vertigo. Has difficulty getting out of recliner.  Pt accompanied by:  daughter, Devonne Doughty  PERTINENT HISTORY: CVA 04/20/2023 affecting L side of body  PRECAUTIONS: Fall  WEIGHT BEARING RESTRICTIONS: No  PAIN:  Are you having pain? No  FALLS: Has patient fallen in last 6 months? Yes. Number of falls 2  LIVING ENVIRONMENT: Lives with: lives with their family and lives alone Lives in: House/apartment Stairs: Yes: Internal: 12 steps; on left going up and External: 3 steps; on right going up; able to live on main Has following equipment at home: Single point cane and Walker - 2 wheeled  PLOF: Independent; driving; works as a Engineer, technical sales 3 days a week  PATIENT GOALS: return to PLOF  OBJECTIVE:   HAND DOMINANCE: Right  ADLs: Overall ADLs: mod I Equipment:  handheld shower head  IADLs: Shopping: dependent Light housekeeping: dependent Meal Prep:  dependent as children are staying with her - typically make egg, [place] bacon, and yogurt in the morning Community mobility: not driving  MOBILITY STATUS: Needs Assist: use of cane and SB to CGA  ACTIVITY TOLERANCE: Activity tolerance: fair  UPPER EXTREMITY ROM:     BUE - WNL  UPPER EXTREMITY MMT:     MMT Right (eval) Left (eval)  Shoulder flexion WNL 4-/5  Shoulder abduction WNL 4/5  Elbow flexion WNL WFL  Elbow extension WNL WFL  (Blank rows = not tested)  HAND FUNCTION: Grip strength: Right: 46.5 lbs; Left: 34.3 lbs  COORDINATION: 9 Hole Peg test: Right: 27 sec; Left: 27  sec  SENSATION: Tingling reported on L side especially at night  EDEMA: none reported or observed  MUSCLE TONE: WNL  COGNITION: Overall cognitive status: Within functional limits for tasks assessed  VISION: Subjective report: no change Baseline vision: Wears glasses all the time and has hx of dry eyes; progressive lenses   PERCEPTION: WFL  PRAXIS: WFL  OBSERVATIONS: Pt appears well-kept. Ambulates with cane. No LOB though altered gait. Glasses donned.    TODAY'S TREATMENT:                                                                                                                               N/a this date  PATIENT EDUCATION: Education details: OT Role and POC Person educated: Patient and Child(ren) Education method: Explanation Education comprehension: verbalized understanding  HOME EXERCISE PROGRAM: None this date   GOALS:  LONG TERM GOALS: Target date: 05/22/2023  Pt will be independent with LUE HEP.  Baseline:  Goal status: INITIAL  2.  Patient will demonstrate at least 39 lbs L grip strength as needed to open jars and other containers. Baseline: 34.3 lbs Goal status: INITIAL  3.  Pt will complete stove top meal prep mod I or better.  Baseline: dependent Goal status: INITIAL  4.  Pt will demonstrate good safety awareness with completion of light housekeeping task at mod I or better.   Baseline: dependent Goal status: INITIAL  ASSESSMENT:  CLINICAL IMPRESSION: Patient is a 82 y.o. female who was seen today for occupational therapy evaluation for CVA. Hx includes HLD, depression, HTN, hearing loss, TIA, vertigo, osteoporosis, and skin cancer. Patient currently presents below baseline level of functioning demonstrating functional deficits and impairments as noted below. Pt would benefit from skilled OT services in the outpatient setting to work on impairments as noted below to help pt return to PLOF as able.    PERFORMANCE DEFICITS: in functional  skills including ADLs, IADLs, coordination, strength, Fine motor control, and UE functional use  IMPAIRMENTS: are limiting patient from ADLs, IADLs, work, and leisure.   CO-MORBIDITIES: may have co-morbidities  that affects occupational performance. Patient will benefit from skilled OT to address above impairments and improve overall function.  MODIFICATION OR ASSISTANCE TO COMPLETE EVALUATION: No modification of tasks or assist necessary to complete an evaluation.  OT OCCUPATIONAL PROFILE AND HISTORY: Problem focused assessment: Including review of records relating to presenting problem.  CLINICAL DECISION MAKING: LOW - limited treatment options, no task modification necessary  REHAB POTENTIAL: Good  EVALUATION COMPLEXITY: Low    PLAN:  OT FREQUENCY: 1x/week  OT DURATION: 3 weeks  PLANNED INTERVENTIONS: self care/ADL training, therapeutic exercise, therapeutic activity, neuromuscular re-education, ultrasound, patient/family education, energy conservation, DME and/or AE instructions, and Re-evaluation  RECOMMENDED OTHER SERVICES: Will monitor need for ST for speech and reported word finding issues  CONSULTED AND AGREED WITH PLAN OF CARE: Patient and family member/caregiver  PLAN FOR NEXT SESSION: LUE strengthening HEP   Delana Meyer, OT 04/28/2023, 6:06 PM

## 2023-05-04 ENCOUNTER — Inpatient Hospital Stay: Payer: Medicare Other | Admitting: Family Medicine

## 2023-05-05 ENCOUNTER — Encounter: Payer: Self-pay | Admitting: Family Medicine

## 2023-05-05 ENCOUNTER — Ambulatory Visit: Payer: Medicare Other | Admitting: Occupational Therapy

## 2023-05-05 ENCOUNTER — Ambulatory Visit (INDEPENDENT_AMBULATORY_CARE_PROVIDER_SITE_OTHER): Payer: Medicare Other | Admitting: Family Medicine

## 2023-05-05 VITALS — BP 145/70 | HR 60 | Temp 97.8°F | Ht 63.0 in | Wt 147.4 lb

## 2023-05-05 DIAGNOSIS — I6389 Other cerebral infarction: Secondary | ICD-10-CM

## 2023-05-05 DIAGNOSIS — E785 Hyperlipidemia, unspecified: Secondary | ICD-10-CM

## 2023-05-05 DIAGNOSIS — E119 Type 2 diabetes mellitus without complications: Secondary | ICD-10-CM

## 2023-05-05 DIAGNOSIS — I10 Essential (primary) hypertension: Secondary | ICD-10-CM

## 2023-05-05 DIAGNOSIS — Z7189 Other specified counseling: Secondary | ICD-10-CM

## 2023-05-05 NOTE — Patient Instructions (Addendum)
Goal for stroke treatment and prevention  BP under 130/80   LDL cholesterol under 70  If you change your mind about treatment I can refer you to cardiology and neurology or if you change your mind about medicines   If you develop new stroke symptoms please call 911   Keep doing your PT   Schedule fasting labs in 3 months then follow up

## 2023-05-05 NOTE — Assessment & Plan Note (Signed)
Given paperwork for advance directive to work on with family

## 2023-05-05 NOTE — Progress Notes (Signed)
Subjective:    Patient ID: Connie Bell, female    DOB: [birth year], 82 y.o.   MRN: [id number]  HPI Pt presents for follow up after hospitalization for TIA  Wt Readings from Last 3 Encounters:  05/05/23 147 lb 6 oz (66.8 kg)  04/20/23 150 lb (68 kg)  02/18/23 158 lb 6 oz (71.8 kg)   26.11 kg/m  Vitals:   05/05/23 0808 05/05/23 0840  BP: (!) 146/70 (!) 145/70  Pulse: 60   Temp: 97.8 F (36.6 C)   SpO2: 97%     Hosp from 6/3 to 04/21/23 Presented with L sided weakness and slurred speech (headache and dizziness the night prior) Had gait imbalance and fall the next am  Told her family later that day and brought to ER Bp was 184/86 at presentation   Noted small vessel dz on CT EKG -possible a flutter/ not high rate but then NSR MRI: IMPRESSION: 1. No acute intracranial abnormality. 2. Findings of chronic small vessel ischemia. Electronically Signed By: Deatra Robinson M.D   Hospital Course:    Following conditions were addressed during hospitalization as listed below,   Right pontine stroke likely from small vessel disease  Patient had improvement neurological signs of during hospitalization.  Seen by PT OT and recommend outpatient PT evaluation.  Patient underwent MRI of the brain CTA of the head and neck.  Patient was seen by neurology and impression is right pontine stroke likely secondary to small vessel disease.  Neurology has recommended aspirin and Plavix for 21 days followed by aspirin alone with initiation of Crestor 20 mg daily.  2D echocardiogram showed preserved LV function.   History of hypertension.  Previous visit without high blood pressure as per the patient.  Accelerated when she came in.  Improved prior to discharge.  Patient will need to follow-up with her primary care provider within 1 week to assess for blood pressure and get treated if necessary.  Patient stated that her blood pressure was normal recently when she had visited her   Hyperlipidemia as per the  chart.  Lipid profile noted with the total cholesterol of 230 and LDL of 131.   Mention of Lipitor with unknown side effects in the computer.  Unclear. Discussed with neurology and patient has been initiated on Crestor 20mg  by neurology.  Will continue after discharge.   Disposition.  At this time, patient is stable for disposition home with outpatient PT follow-up.  CMP     Component Value Date/Time   NA 141 04/20/2023 1554   K 3.7 04/20/2023 1554   CL 104 04/20/2023 1554   CO2 25 04/20/2023 1548   GLUCOSE 89 04/20/2023 1554   BUN 11 04/20/2023 1554   CREATININE 0.80 04/20/2023 1554   CALCIUM 9.3 04/20/2023 1548   PROT 7.7 04/20/2023 1548   ALBUMIN 4.0 04/20/2023 1548   AST 17 04/20/2023 1548   ALT 20 04/20/2023 1548   ALKPHOS 75 04/20/2023 1548   BILITOT 0.8 04/20/2023 1548   GFR 68.42 07/19/2019 1502   GFRNONAA >60 04/20/2023 1548   Lab Results  Component Value Date   CHOL 230 (H) 04/21/2023   HDL 41 04/21/2023   LDLCALC 131 (H) 04/21/2023   LDLDIRECT 151.0 07/19/2019   TRIG 290 (H) 04/21/2023   CHOLHDL 5.6 04/21/2023   Lab Results  Component Value Date   WBC 5.4 04/20/2023   HGB 13.6 04/20/2023   HCT 40.0 04/20/2023   MCV 93.7 04/20/2023   PLT 347 04/20/2023  Lab Results  Component Value Date   INR 1.0 04/20/2023   Lab Results  Component Value Date   HGBA1C 6.5 (H) 04/20/2023   H/o not wanting treatment for any of her chronic problems   Refuses a statin  Taking a supplement with omega and some other ingredients hoping it will help  With melaluka  Taking it for 2 week and no side effect  Is taking the asa  Refuses the statin    Is checking bp at home  112/77  Normally in 120s  Much better at home     Much better now  Was weak on L side  Uses cane for stability  Is going to physical therapy  Needs work with balance   Feels like her speech is back to normal and face is symmetric   Is eating a lot better for sugar and for cholesterol      Patient Active Problem List   Diagnosis Date Noted   Advance directive discussed with patient 05/05/2023   CVA (cerebral vascular accident) (HCC) 04/20/2023   Vertigo 04/20/2023   Hearing loss 02/18/2023   History of COVID-19 11/24/2019   Type 2 diabetes mellitus without complication, without long-term current use of insulin (HCC) 07/26/2019   Essential hypertension 11/23/2018   Routine general medical examination at a health care facility 07/07/2017   Heme positive stool 08/08/2016   Colon cancer screening 07/14/2016   Family history of colon cancer 07/14/2016   Pre-employment examination 08/26/2013   Hemorrhoids 10/28/2011   HEMORRHOIDS, INTERNAL 01/08/2011   Hyperlipemia 01/12/2008   Depression 01/12/2008   ROSACEA 01/12/2008   Past Medical History:  Diagnosis Date   COVID-19    Depression    Hyperlipemia    borderline not on Rx.   Osteoporosis    Rosacea    Skin cancer    on nose and neck 2 times/basal cell   Past Surgical History:  Procedure Laterality Date   COLONOSCOPY     HEMORRHOID SURGERY     Pt passed out past surgery due to pain!   PARTIAL HYSTERECTOMY     secondary to abn pap   Patient refuses     mammograms, chol treatment and vaccines   POLYPECTOMY     Social History   Tobacco Use   Smoking status: Never   Smokeless tobacco: Never  Vaping Use   Vaping Use: Never used  Substance Use Topics   Alcohol use: No   Drug use: No   Family History  Problem Relation Age of Onset   Heart disease Mother        MI   Cancer Sister 26       lung cancer   Lung cancer Sister    Colon cancer Brother 2       died at 42   Heart disease Father        CHF   Stomach cancer Maternal Aunt    Colon polyps Neg Hx    Esophageal cancer Neg Hx    Rectal cancer Neg Hx    Breast cancer Neg Hx    Allergies  Allergen Reactions   Alendronate Sodium     REACTION: depressed, headaches, stomach aches   Morphine     REACTION: /whelts   Atorvastatin      REACTION: pt does not remember   Current Outpatient Medications on File Prior to Visit  Medication Sig Dispense Refill   aspirin EC 81 MG tablet Take 1 tablet (81 mg total) by  mouth daily. Swallow whole. 30 tablet 2   Cholecalciferol (VITAMIN D3) 25 MCG (1000 UT) CAPS Take 1 capsule by mouth daily.     Multiple Vitamin (MULTIVITAMIN WITH MINERALS) TABS tablet Take 1 tablet by mouth daily.     clopidogrel (PLAVIX) 75 MG tablet Take 1 tablet (75 mg total) by mouth daily for 21 days. (Patient not taking: Reported on 04/28/2023) 21 tablet 0   rosuvastatin (CRESTOR) 20 MG tablet Take 1 tablet (20 mg total) by mouth daily. (Patient not taking: Reported on 04/28/2023) 30 tablet 2   No current facility-administered medications on file prior to visit.    Review of Systems  Constitutional:  Negative for activity change, appetite change, fatigue, fever and unexpected weight change.  HENT:  Negative for congestion, ear pain, rhinorrhea, sinus pressure and sore throat.   Eyes:  Negative for pain, redness and visual disturbance.  Respiratory:  Negative for cough, shortness of breath and wheezing.   Cardiovascular:  Negative for chest pain and palpitations.  Gastrointestinal:  Negative for abdominal pain, blood in stool, constipation and diarrhea.  Endocrine: Negative for polydipsia and polyuria.  Genitourinary:  Negative for dysuria, frequency and urgency.  Musculoskeletal:  Negative for arthralgias, back pain and myalgias.  Skin:  Negative for pallor and rash.  Allergic/Immunologic: Negative for environmental allergies.  Neurological:  Positive for weakness. Negative for dizziness, syncope and headaches.  Hematological:  Negative for adenopathy. Does not bruise/bleed easily.  Psychiatric/Behavioral:  Negative for decreased concentration and dysphoric mood. The patient is not nervous/anxious.        Objective:   Physical Exam Constitutional:      General: She is not in acute distress.     Appearance: Normal appearance. She is well-developed and normal weight. She is not ill-appearing or diaphoretic.  HENT:     Head: Normocephalic and atraumatic.  Eyes:     Conjunctiva/sclera: Conjunctivae normal.     Pupils: Pupils are equal, round, and reactive to light.  Neck:     Thyroid: No thyromegaly.     Vascular: No carotid bruit or JVD.  Cardiovascular:     Rate and Rhythm: Normal rate and regular rhythm.     Heart sounds: Normal heart sounds.     No gallop.  Pulmonary:     Effort: Pulmonary effort is normal. No respiratory distress.     Breath sounds: Normal breath sounds. No wheezing or rales.  Abdominal:     General: There is no distension or abdominal bruit.     Palpations: Abdomen is soft.  Musculoskeletal:     Cervical back: Normal range of motion and neck supple.     Right lower leg: No edema.     Left lower leg: No edema.  Lymphadenopathy:     Cervical: No cervical adenopathy.  Skin:    General: Skin is warm and dry.     Coloration: Skin is not pale.     Findings: No rash.  Neurological:     Mental Status: She is alert.     Coordination: Coordination normal.     Deep Tendon Reflexes: Reflexes are normal and symmetric. Reflexes normal.     Comments: Some mild left sided weakness with gait  Otherwise no focal neuro changes   Psychiatric:        Mood and Affect: Mood normal. Affect is blunt.        Cognition and Memory: Cognition and memory normal.           Assessment &  Plan:   Problem List Items Addressed This Visit       Cardiovascular and Mediastinum   Essential hypertension    Despite recent stroke declines any treatment  BP: (!) 145/70  Lower at home  Reviewed good lifestyle goals      CVA (cerebral vascular accident) (HCC) - Primary    Right pontine stroke from small vessel dz  Reviewed hospital records, lab results and studies in detail   Doing PT for weakness -much improved Declines plavix but will take asa 81 mg  Declines treatment  of HTN or dm  Declines statin or px med for cholesterol Declines ref to cardiology or neuro  Discussed goals Aware at risk for another event   Advised to work on advanced directive          Endocrine   Type 2 diabetes mellitus without complication, without long-term current use of insulin (HCC)    Lab Results  Component Value Date   HGBA1C 6.5 (H) 04/20/2023  Despite recent stroke declines treatment  Encouraged low glycemic diet         Other   Hyperlipemia    Despite recent stroke declines statin  Disc goals for lipids and reasons to control them Rev last labs with pt Rev low sat fat diet in detail LDL of 131 Goal below 70   Trying herbal product  Re check 3 months        Advance directive discussed with patient    Given paperwork for advance directive to work on with family

## 2023-05-05 NOTE — Assessment & Plan Note (Signed)
Lab Results  Component Value Date   HGBA1C 6.5 (H) 04/20/2023   Despite recent stroke declines treatment  Encouraged low glycemic diet

## 2023-05-05 NOTE — Assessment & Plan Note (Signed)
Right pontine stroke from small vessel dz  Reviewed hospital records, lab results and studies in detail   Doing PT for weakness -much improved Declines plavix but will take asa 81 mg  Declines treatment of HTN or dm  Declines statin or px med for cholesterol Declines ref to cardiology or neuro  Discussed goals Aware at risk for another event   Advised to work on advanced directive

## 2023-05-05 NOTE — Assessment & Plan Note (Signed)
Despite recent stroke declines statin  Disc goals for lipids and reasons to control them Rev last labs with pt Rev low sat fat diet in detail LDL of 131 Goal below 70   Trying herbal product  Re check 3 months

## 2023-05-05 NOTE — Assessment & Plan Note (Signed)
Despite recent stroke declines any treatment  BP: (!) 145/70  Lower at home  Reviewed good lifestyle goals

## 2023-05-08 ENCOUNTER — Ambulatory Visit: Payer: Medicare Other | Admitting: Physical Therapy

## 2023-05-08 DIAGNOSIS — M6281 Muscle weakness (generalized): Secondary | ICD-10-CM | POA: Diagnosis not present

## 2023-05-08 DIAGNOSIS — R2689 Other abnormalities of gait and mobility: Secondary | ICD-10-CM | POA: Diagnosis not present

## 2023-05-08 DIAGNOSIS — I69354 Hemiplegia and hemiparesis following cerebral infarction affecting left non-dominant side: Secondary | ICD-10-CM

## 2023-05-08 DIAGNOSIS — R2681 Unsteadiness on feet: Secondary | ICD-10-CM | POA: Diagnosis not present

## 2023-05-08 DIAGNOSIS — I639 Cerebral infarction, unspecified: Secondary | ICD-10-CM | POA: Diagnosis not present

## 2023-05-08 NOTE — Therapy (Signed)
OUTPATIENT PHYSICAL THERAPY NEURO TREATMENT- DISCHARGE SUMMARY   Patient Name: Connie Bell MRN: [id number] DOB:[birth year], 82 y.o., female Today's Date: 05/08/2023   PCP: [name], MD REFERRING PROVIDER: Joycelyn Das, MD  PHYSICAL THERAPY DISCHARGE SUMMARY  Visits from Start of Care: 2  Current functional level related to goals / functional outcomes: Mod I w/mobility without AD   Remaining deficits: Decreased endurance, mild imbalance, low fall risk    Education / Equipment: None- educated pt on how to obtain new referral if mobility needs change in future    Patient agrees to discharge. Patient goals were partially met. Patient is being discharged due to the patient's request.   END OF SESSION:  PT End of Session - 05/08/23 1236     Visit Number 2    Number of Visits 7   Plus eval   Date for PT Re-Evaluation 06/09/23    Authorization Type [employer] Medicare    PT Start Time 1234    PT Stop Time 1254   DC   PT Time Calculation (min) 20 min    Equipment Utilized During Treatment Gait belt    Activity Tolerance Patient tolerated treatment well    Behavior During Therapy WFL for tasks assessed/performed              Past Medical History:  Diagnosis Date   COVID-19    Depression    Hyperlipemia    borderline not on Rx.   Osteoporosis    Rosacea    Skin cancer    on nose and neck 2 times/basal cell   Past Surgical History:  Procedure Laterality Date   COLONOSCOPY     HEMORRHOID SURGERY     Pt passed out past surgery due to pain!   PARTIAL HYSTERECTOMY     secondary to abn pap   Patient refuses     mammograms, chol treatment and vaccines   POLYPECTOMY     Patient Active Problem List   Diagnosis Date Noted   Advance directive discussed with patient 05/05/2023   CVA (cerebral vascular accident) (HCC) 04/20/2023   Vertigo 04/20/2023   Hearing loss 02/18/2023   History of COVID-19 11/24/2019   Type 2 diabetes mellitus without complication,  without long-term current use of insulin (HCC) 07/26/2019   Essential hypertension 11/23/2018   Routine general medical examination at a health care facility 07/07/2017   Heme positive stool 08/08/2016   Colon cancer screening 07/14/2016   Family history of colon cancer 07/14/2016   Pre-employment examination 08/26/2013   Hemorrhoids 10/28/2011   HEMORRHOIDS, INTERNAL 01/08/2011   Hyperlipemia 01/12/2008   Depression 01/12/2008   ROSACEA 01/12/2008    ONSET DATE: 04/21/2023 (referral)   REFERRING DIAG: I63.9 (ICD-10-CM) - Cerebrovascular accident (CVA), unspecified mechanism (HCC)  THERAPY DIAG:  Hemiplegia and hemiparesis following cerebral infarction affecting left non-dominant side (HCC)  Other abnormalities of gait and mobility  Rationale for Evaluation and Treatment: Rehabilitation  SUBJECTIVE:  SUBJECTIVE STATEMENT: Pt ambulated into clinic without cane. Reports she is feeling much stronger and is ready to be finished with therapy. Her son is flying back to [place] today so she will be home alone    Pt accompanied by: self  PERTINENT HISTORY: Per OT, pt has history of vertigo. CVA on 04/20/23  PAIN:  Are you having pain? No  PRECAUTIONS: Fall  WEIGHT BEARING RESTRICTIONS: No  FALLS: Has patient fallen in last 6 months? Yes. Number of falls 2  LIVING ENVIRONMENT: Lives with: lives alone Lives in: House/apartment Stairs: Yes: Internal: 1 steps; none and External: 3 steps; on right going up Has following equipment at home: Single point cane, Walker - 2 wheeled, and plans to get shower chair  PLOF: Independent  PATIENT GOALS: "To get better and I wanna drive"   OBJECTIVE:   DIAGNOSTIC FINDINGS: MRI of head on 04/20/23  IMPRESSION: 1. No acute intracranial abnormality. 2.  Findings of chronic small vessel ischemia.  COGNITION: Overall cognitive status: Within functional limits for tasks assessed   SENSATION: Pt reports tingling in her LLE and LUE, more so at night    POSTURE: rounded shoulders and forward head   LOWER EXTREMITY MMT:  Tested in seated position   MMT Right Eval Left Eval  Hip flexion 5 4  Hip extension    Hip abduction 5 5  Hip adduction 5 5  Hip internal rotation    Hip external rotation    Knee flexion 5 4+  Knee extension 5 4+  Ankle dorsiflexion 5 5  Ankle plantarflexion    Ankle inversion    Ankle eversion    (Blank rows = not tested)    TODAY'S TREATMENT:    Ther Act   [facility] PT Assessment - 05/08/23 1242       Functional Gait  Assessment   Gait assessed  Yes    Gait Level Surface Walks 20 ft in less than 7 sec but greater than 5.5 sec, uses assistive device, slower speed, mild gait deviations, or deviates 6-10 in outside of the 12 in walkway width.   6.12s   Change in Gait Speed Able to change speed, demonstrates mild gait deviations, deviates 6-10 in outside of the 12 in walkway width, or no gait deviations, unable to achieve a major change in velocity, or uses a change in velocity, or uses an assistive device.    Gait with Horizontal Head Turns Performs head turns smoothly with no change in gait. Deviates no more than 6 in outside 12 in walkway width    Gait with Vertical Head Turns Performs head turns with no change in gait. Deviates no more than 6 in outside 12 in walkway width.    Gait and Pivot Turn Pivot turns safely within 3 sec and stops quickly with no loss of balance.    Step Over Obstacle Is able to step over 2 stacked shoe boxes taped together (9 in total height) without changing gait speed. No evidence of imbalance.    Gait with Narrow Base of Support Is able to ambulate for 10 steps heel to toe with no staggering.    Gait with Eyes Closed Walks 20 ft, uses assistive device, slower speed, mild gait  deviations, deviates 6-10 in outside 12 in walkway width. Ambulates 20 ft in less than 9 sec but greater than 7 sec.    Ambulating Backwards Walks 20 ft, uses assistive device, slower speed, mild gait deviations, deviates 6-10 in outside 12 in walkway  width.   19s   Steps Alternating feet, must use rail.    Total Score 25    FGA comment: low fall risk              GAIT: Gait pattern: WFL Distance walked: various clinic distances  Assistive device utilized: None Level of assistance: Modified independence Comments: No instability noted. Pt much more confident w/gait this date                                                                                  PATIENT EDUCATION: Education details: Contacting MD about follow up to be cleared to drive, FGA results, how to obtain new PT referral if mobility needs change  Person educated: Patient Education method: Explanation Education comprehension: verbalized understanding  HOME EXERCISE PROGRAM: To be established  GOALS: Goals reviewed with patient? Yes  SHORT TERM GOALS: Target date: 05/26/2023   Pt will be independent with initial HEP for improved strength, balance, transfers and gait.  Baseline: not established on eval Goal status: NOT MET  2.  Floor transfer to be assessed and STG/LTG updated as appropriate  Baseline:  Goal status: NOT MET  3.  Pt will improve FGA to 16/30 for decreased fall risk   Baseline: 12/30; 25/30 (6/21) Goal status: MET   LONG TERM GOALS: Target date: 06/09/2023 '  Pt will be independent with final HEP for improved strength, balance, transfers and gait.  Baseline:  Goal status: NOT MET  2.  Pt will improve FGA to 21/30 for decreased fall risk   Baseline: 12/30; 25/30 (6/21) Goal status: MET  3.  Pt will improve gait velocity to at least 3.2 ft/s w/LRAD for improved gait efficiency and independence  Baseline: 2.97 ft/s no AD, 2.5 ft/s w/SPC Goal status: NOT MET  4.  Floor  transfer goal  Baseline:  Goal status: NOT MET    ASSESSMENT:  CLINICAL IMPRESSION: Emphasis of skilled PT session on brief goal assessment and DC from PT. Pt reports she feels much stronger now compared to eval and is not longer using SPC. Reassessed FGA and pt improved her score to 25/30, up from 12/30 from eval, indicative of low fall risk. Pt reports her son is flying back to [place] and she is very excited to be home alone. Pt states she wants to be cleared to drive and return to PLOF. From a PT perspective, pt is safe to drive but will need clearance from OT and MD to be cleared to drive. Educated pt on how to obtain new PT referral in future if mobility needs change, pt verbalized understanding. Remainder of pt's goals DC as this is her second visit.    OBJECTIVE IMPAIRMENTS: Abnormal gait, decreased balance, decreased knowledge of use of DME, decreased mobility, difficulty walking, decreased strength, and impaired sensation  ACTIVITY LIMITATIONS: carrying, hygiene/grooming, and locomotion level  PARTICIPATION LIMITATIONS: meal prep, cleaning, laundry, driving, shopping, community activity, and yard work  PERSONAL FACTORS: Age, Fitness, and 1 comorbidity: HTN  are also affecting patient's functional outcome.   REHAB POTENTIAL: Good  CLINICAL DECISION MAKING: Evolving/moderate complexity  EVALUATION COMPLEXITY: Moderate  PLAN:  PT FREQUENCY: 1x/week  PT DURATION:  6 weeks  PLANNED INTERVENTIONS: Therapeutic exercises, Therapeutic activity, Neuromuscular re-education, Balance training, Gait training, Patient/Family education, Self Care, Joint mobilization, Stair training, Vestibular training, Canalith repositioning, DME instructions, Aquatic Therapy, Electrical stimulation, Manual therapy, and Re-evaluation    Jahnyla Parrillo E Kolby Schara, PT, DPT 05/08/2023, 12:54 PM

## 2023-05-13 ENCOUNTER — Ambulatory Visit: Payer: Medicare Other | Admitting: Occupational Therapy

## 2023-05-13 DIAGNOSIS — I69354 Hemiplegia and hemiparesis following cerebral infarction affecting left non-dominant side: Secondary | ICD-10-CM | POA: Diagnosis not present

## 2023-05-13 DIAGNOSIS — R2681 Unsteadiness on feet: Secondary | ICD-10-CM | POA: Diagnosis not present

## 2023-05-13 DIAGNOSIS — I639 Cerebral infarction, unspecified: Secondary | ICD-10-CM | POA: Diagnosis not present

## 2023-05-13 DIAGNOSIS — M6281 Muscle weakness (generalized): Secondary | ICD-10-CM | POA: Diagnosis not present

## 2023-05-13 DIAGNOSIS — R2689 Other abnormalities of gait and mobility: Secondary | ICD-10-CM

## 2023-05-13 NOTE — Therapy (Signed)
OUTPATIENT OCCUPATIONAL THERAPY NEURO EVALUATION  Patient Name: Connie Bell MRN: [id number] DOB:1940/12/17, 82 y.o., female Today's Date: 05/13/2023  PCP: [name], MD  REFERRING PROVIDER: Joycelyn Das, MD   END OF SESSION:  OT End of Session - 05/13/23 1409     Visit Number 2    Number of Visits 4    Date for OT Re-Evaluation 05/22/23    Authorization Type [employer] Medicare    Progress Note Due on Visit 4    OT Start Time 1407    Activity Tolerance Patient tolerated treatment well    Behavior During Therapy [facility] for tasks assessed/performed             Past Medical History:  Diagnosis Date   COVID-19    Depression    Hyperlipemia    borderline not on Rx.   Osteoporosis    Rosacea    Skin cancer    on nose and neck 2 times/basal cell   Past Surgical History:  Procedure Laterality Date   COLONOSCOPY     HEMORRHOID SURGERY     Pt passed out past surgery due to pain!   PARTIAL HYSTERECTOMY     secondary to abn pap   Patient refuses     mammograms, chol treatment and vaccines   POLYPECTOMY     Patient Active Problem List   Diagnosis Date Noted   Advance directive discussed with patient 05/05/2023   CVA (cerebral vascular accident) (HCC) 04/20/2023   Vertigo 04/20/2023   Hearing loss 02/18/2023   History of COVID-19 11/24/2019   Type 2 diabetes mellitus without complication, without long-term current use of insulin (HCC) 07/26/2019   Essential hypertension 11/23/2018   Routine general medical examination at a health care facility 07/07/2017   Heme positive stool 08/08/2016   Colon cancer screening 07/14/2016   Family history of colon cancer 07/14/2016   Pre-employment examination 08/26/2013   Hemorrhoids 10/28/2011   HEMORRHOIDS, INTERNAL 01/08/2011   Hyperlipemia 01/12/2008   Depression 01/12/2008   ROSACEA 01/12/2008    ONSET DATE: 04/21/2023 (referral)   REFERRING DIAG: I63.9 (ICD-10-CM) - Cerebrovascular accident (CVA), unspecified  mechanism   THERAPY DIAG:  Hemiplegia and hemiparesis following cerebral infarction affecting left non-dominant side (HCC)  Other abnormalities of gait and mobility  Muscle weakness (generalized)  Unsteadiness on feet  Rationale for Evaluation and Treatment: Rehabilitation  SUBJECTIVE:   SUBJECTIVE STATEMENT: She has had significant improvement from when she was in the hospital.  She was told she should use a RW but it does not fit in her home well. She would like to go without AD for ambulation all together. History of vertigo. Has difficulty getting out of recliner.  Pt accompanied by: self  PERTINENT HISTORY: CVA 04/20/2023 affecting L side of body  PRECAUTIONS: Fall  WEIGHT BEARING RESTRICTIONS: No  PAIN:  Are you having pain? No  FALLS: Has patient fallen in last 6 months? Yes. Number of falls 2  LIVING ENVIRONMENT: Lives with: lives with their family and lives alone Lives in: House/apartment Stairs: Yes: Internal: 12 steps; on left going up and External: 3 steps; on right going up; able to live on main Has following equipment at home: Single point cane and Walker - 2 wheeled  PLOF: Independent; driving; works as a Engineer, technical sales 3 days a week  PATIENT GOALS: return to PLOF  OBJECTIVE:   HAND DOMINANCE: Right  ADLs: Overall ADLs: mod I Equipment:  handheld shower head  IADLs: Shopping: dependent Light housekeeping: dependent  Meal Prep: dependent as children are staying with her - typically make egg, [place] bacon, and yogurt in the morning Community mobility: not driving  MOBILITY STATUS: Needs Assist: use of cane and SB to CGA  ACTIVITY TOLERANCE: Activity tolerance: fair  UPPER EXTREMITY ROM:     BUE - WNL  UPPER EXTREMITY MMT:     MMT Right (eval) Left (eval)  Shoulder flexion WNL 4-/5  Shoulder abduction WNL 4/5  Elbow flexion WNL WFL  Elbow extension WNL WFL  (Blank rows = not tested)  HAND FUNCTION: Grip strength: Right: 46.5 lbs; Left: 34.3  lbs 05/13/2023: L - 36.8 lbs  COORDINATION: 9 Hole Peg test: Right: 27 sec; Left: 27 sec  SENSATION: Tingling reported on L side especially at night  EDEMA: none reported or observed  MUSCLE TONE: WNL  COGNITION: Overall cognitive status: Within functional limits for tasks assessed  VISION: Subjective report: no change Baseline vision: Wears glasses all the time and has hx of dry eyes; progressive lenses   PERCEPTION: WFL  PRAXIS: WFL  OBSERVATIONS: Pt appears well-kept. Ambulates with cane. No LOB though altered gait. Glasses donned.    TODAY'S TREATMENT:             Objective measures assessed as noted in Goals section to determine progression towards goals and appropriate putty resistance.                                                                                                                    OT initiated red theraputty exercises (search, grip, pinch, key grip, spread, and rope squeeze) as noted in patient instructions for coordination and strength  Wrist flex and ext with yellow flex bar x 2 min for strength and endurance of affected extremity  Supination with yellow flex bar x 2 min for strength and endurance of affected extremity  Pronation with yellow flex bar x 2 min for strength and endurance of affected extremity  Placement (2 point pinch) and removal (3 point pinch) of yellow, red, green, blue, and black resistive clips with use of left for strengthening of affected extremity.  OT initiated L bicep curl to shoulder press HEP as noted in pt instructions to improve LUE strength. Pt unable to maintain proper positioning with 4 lb weight but was able to improve positioning with 3 lb.   PATIENT EDUCATION: Education details: strengthening Person educated: Patient Education method: Programmer, multimedia, Demonstration, and Handouts Education comprehension: verbalized understanding, returned demonstration, and needs further education  HOME EXERCISE  PROGRAM: 05/13/2023 - red putty and bicep curl to shoulder press HEP  GOALS:  LONG TERM GOALS: Target date: 05/22/2023  Pt will be independent with LUE HEP.  Baseline:  Goal status: IN PROGRESS  2.  Patient will demonstrate at least 39 lbs L grip strength as needed to open jars and other containers. Baseline: 34.3 lbs 05/13/2023: 36.8 lbs Goal status: IN PROGRESS  3.  Pt will complete stove top meal prep mod I or better.  Baseline: dependent Goal status:  INITIAL  4.  Pt will demonstrate good safety awareness with completion of light housekeeping task at mod I or better.   Baseline: dependent Goal status: INITIAL  ASSESSMENT:  CLINICAL IMPRESSION: Pt demonstrates good tolerance and understanding of HEP as needed to progress towards goals. Demonstrates limited endurance as needed for completion of daily activities.  PERFORMANCE DEFICITS: in functional skills including ADLs, IADLs, coordination, strength, Fine motor control, and UE functional use  IMPAIRMENTS: are limiting patient from ADLs, IADLs, work, and leisure.   CO-MORBIDITIES: may have co-morbidities  that affects occupational performance. Patient will benefit from skilled OT to address above impairments and improve overall function.  REHAB POTENTIAL: Good   PLAN:  OT FREQUENCY: 1x/week  OT DURATION: 3 weeks  PLANNED INTERVENTIONS: self care/ADL training, therapeutic exercise, therapeutic activity, neuromuscular re-education, ultrasound, patient/family education, energy conservation, DME and/or AE instructions, and Re-evaluation  RECOMMENDED OTHER SERVICES: Will monitor need for ST for speech and reported word finding issues  CONSULTED AND AGREED WITH PLAN OF CARE: Patient and family member/caregiver  PLAN FOR NEXT SESSION: review LUE strengthening HEP   Delana Meyer, OT 05/13/2023, 2:10 PM

## 2023-05-15 ENCOUNTER — Ambulatory Visit: Payer: Medicare Other | Admitting: Physical Therapy

## 2023-05-19 ENCOUNTER — Ambulatory Visit: Payer: Medicare Other | Attending: Internal Medicine | Admitting: Occupational Therapy

## 2023-05-19 ENCOUNTER — Ambulatory Visit: Payer: Medicare Other | Admitting: Physical Therapy

## 2023-05-19 DIAGNOSIS — M6281 Muscle weakness (generalized): Secondary | ICD-10-CM | POA: Diagnosis not present

## 2023-05-19 DIAGNOSIS — R2681 Unsteadiness on feet: Secondary | ICD-10-CM | POA: Diagnosis not present

## 2023-05-19 DIAGNOSIS — R2689 Other abnormalities of gait and mobility: Secondary | ICD-10-CM | POA: Insufficient documentation

## 2023-05-19 DIAGNOSIS — I69354 Hemiplegia and hemiparesis following cerebral infarction affecting left non-dominant side: Secondary | ICD-10-CM | POA: Diagnosis not present

## 2023-05-19 NOTE — Therapy (Signed)
OUTPATIENT OCCUPATIONAL THERAPY NEURO TREATMENT and DISCHARGE  Patient Name: Connie Bell MRN: [id number] DOB:Mar 30, 1941, 82 y.o., female Today's Date: 05/19/2023  OCCUPATIONAL THERAPY DISCHARGE SUMMARY  Visits from Start of Care: 3  Current functional level related to goals / functional outcomes: Patient has met 4/4 long-term goals to date.   Remaining deficits: None   Education / Equipment: Continue with LUE HEP as needed to maintain current level of strength.    Patient agrees to discharge. Patient goals were met. Patient is being discharged due to meeting the stated rehab goals.Marland Kitchen   PCP: [name], MD  REFERRING PROVIDER: Joycelyn Das, MD   END OF SESSION:  OT End of Session - 05/19/23 1103     Visit Number 3    Number of Visits 4    Date for OT Re-Evaluation 05/22/23    Authorization Type [employer] Medicare    Progress Note Due on Visit 4    OT Start Time 1105    OT Stop Time 1131    OT Time Calculation (min) 26 min    Activity Tolerance Patient tolerated treatment well    Behavior During Therapy WFL for tasks assessed/performed             Past Medical History:  Diagnosis Date   COVID-19    Depression    Hyperlipemia    borderline not on Rx.   Osteoporosis    Rosacea    Skin cancer    on nose and neck 2 times/basal cell   Past Surgical History:  Procedure Laterality Date   COLONOSCOPY     HEMORRHOID SURGERY     Pt passed out past surgery due to pain!   PARTIAL HYSTERECTOMY     secondary to abn pap   Patient refuses     mammograms, chol treatment and vaccines   POLYPECTOMY     Patient Active Problem List   Diagnosis Date Noted   Advance directive discussed with patient 05/05/2023   CVA (cerebral vascular accident) (HCC) 04/20/2023   Vertigo 04/20/2023   Hearing loss 02/18/2023   History of COVID-19 11/24/2019   Type 2 diabetes mellitus without complication, without long-term current use of insulin (HCC) 07/26/2019   Essential  hypertension 11/23/2018   Routine general medical examination at a health care facility 07/07/2017   Heme positive stool 08/08/2016   Colon cancer screening 07/14/2016   Family history of colon cancer 07/14/2016   Pre-employment examination 08/26/2013   Hemorrhoids 10/28/2011   HEMORRHOIDS, INTERNAL 01/08/2011   Hyperlipemia 01/12/2008   Depression 01/12/2008   ROSACEA 01/12/2008    ONSET DATE: 04/21/2023 (referral)   REFERRING DIAG: I63.9 (ICD-10-CM) - Cerebrovascular accident (CVA), unspecified mechanism   THERAPY DIAG:  Hemiplegia and hemiparesis following cerebral infarction affecting left non-dominant side (HCC)  Other abnormalities of gait and mobility  Muscle weakness (generalized)  Unsteadiness on feet  Rationale for Evaluation and Treatment: Rehabilitation  SUBJECTIVE:   SUBJECTIVE STATEMENT: Pt was able to clean her house to be listed on the market. She feels that she has her strength back and her daughter feels like her mother is pretty much back to her PLOF.   Pt accompanied by: self  PERTINENT HISTORY: CVA 04/20/2023 affecting L side of body  PRECAUTIONS: Fall  WEIGHT BEARING RESTRICTIONS: No  PAIN:  Are you having pain? No  FALLS: Has patient fallen in last 6 months? Yes. Number of falls 2  LIVING ENVIRONMENT: Lives with: lives with their family and lives alone Lives in:  House/apartment Stairs: Yes: Internal: 12 steps; on left going up and External: 3 steps; on right going up; able to live on main Has following equipment at home: Single point cane and Walker - 2 wheeled  PLOF: Independent; driving; works as a Engineer, technical sales 3 days a week  PATIENT GOALS: return to PLOF  OBJECTIVE:   HAND DOMINANCE: Right  ADLs: Overall ADLs: mod I Equipment:  handheld shower head  IADLs: Shopping: dependent Light housekeeping: dependent Meal Prep: dependent as children are staying with her - typically make egg, [place] bacon, and yogurt in the morning Community  mobility: not driving  MOBILITY STATUS: Needs Assist: use of cane and SB to CGA  ACTIVITY TOLERANCE: Activity tolerance: fair  UPPER EXTREMITY ROM:     BUE - WNL  UPPER EXTREMITY MMT:     MMT Right (eval) Left (eval) Left  05/19/2023  Shoulder flexion WNL 4-/5 4+/5  Shoulder abduction WNL 4/5 4+/5  Elbow flexion WNL WFL 5/5  Elbow extension WNL WFL 5/5  (Blank rows = not tested)  HAND FUNCTION: Grip strength: Right: 46.5 lbs; Left: 34.3 lbs 05/13/2023: L - 36.8 lbs  COORDINATION: 9 Hole Peg test: Right: 27 sec; Left: 27 sec  SENSATION: Tingling reported on L side especially at night  EDEMA: none reported or observed  MUSCLE TONE: WNL  COGNITION: Overall cognitive status: Within functional limits for tasks assessed  VISION: Subjective report: no change Baseline vision: Wears glasses all the time and has hx of dry eyes; progressive lenses   PERCEPTION: WFL  PRAXIS: WFL  OBSERVATIONS: Pt appears well-kept. Ambulates with cane. No LOB though altered gait. Glasses donned.    TODAY'S TREATMENT:             - Self-care/home management completed for duration as noted below including: Therapist reviewed goals with patient and updated patient progression.  No additional functional limitations identified. Objective measures assessed as noted in Goals section to determine progression towards goals. - Therapeutic exercises completed for duration as noted below including: OT reviewed LUE putty and strengthening HEP. Pt requiring min cueing initially then improving to independence with exercises.   PATIENT EDUCATION: Education details: Print production planner; OT d/c Person educated: Patient Education method: Medical illustrator Education comprehension: verbalized understanding and returned demonstration  HOME EXERCISE PROGRAM: 05/13/2023 - red putty and bicep curl to shoulder press HEP  GOALS:  LONG TERM GOALS: Target date: 05/22/2023  Pt will be independent with  LUE HEP.  Baseline:  Goal status: MET  2.  Patient will demonstrate at least 39 lbs L grip strength as needed to open jars and other containers. Baseline: 34.3 lbs 05/13/2023: 36.8 lbs 05/19/2023: 52.6 lbs Goal status: MET  3.  Pt will complete stove top meal prep mod I or better.  Baseline: dependent Goal status: MET  4.  Pt will demonstrate good safety awareness with completion of light housekeeping task at mod I or better.   Baseline: dependent Goal status: MET  ASSESSMENT:  CLINICAL IMPRESSION: Pt has met all goals with no further functional limitations noted. Patient is appropriate for discharge and no longer demonstrates medical necessity for continued skilled occupational services. PERFORMANCE DEFICITS: in functional skills including ADLs, IADLs, coordination, strength, Fine motor control, and UE functional use  IMPAIRMENTS: are limiting patient from ADLs, IADLs, work, and leisure.   CO-MORBIDITIES: may have co-morbidities  that affects occupational performance. Patient will benefit from skilled OT to address above impairments and improve overall function.  REHAB POTENTIAL: Good  PLAN:  OT  D/C completed   Delana Meyer, OT 05/19/2023, 12:56 PM

## 2023-07-30 ENCOUNTER — Encounter: Payer: Self-pay | Admitting: Family Medicine

## 2023-07-30 ENCOUNTER — Ambulatory Visit (INDEPENDENT_AMBULATORY_CARE_PROVIDER_SITE_OTHER): Payer: Medicare Other | Admitting: Family Medicine

## 2023-07-30 VITALS — BP 128/68 | HR 70 | Temp 97.9°F | Resp 16 | Ht 63.0 in | Wt 142.4 lb

## 2023-07-30 DIAGNOSIS — Z8673 Personal history of transient ischemic attack (TIA), and cerebral infarction without residual deficits: Secondary | ICD-10-CM | POA: Diagnosis not present

## 2023-07-30 DIAGNOSIS — Z5320 Procedure and treatment not carried out because of patient's decision for unspecified reasons: Secondary | ICD-10-CM

## 2023-07-30 DIAGNOSIS — Z1211 Encounter for screening for malignant neoplasm of colon: Secondary | ICD-10-CM

## 2023-07-30 DIAGNOSIS — I10 Essential (primary) hypertension: Secondary | ICD-10-CM

## 2023-07-30 DIAGNOSIS — Z7189 Other specified counseling: Secondary | ICD-10-CM | POA: Diagnosis not present

## 2023-07-30 LAB — LIPID PANEL
Cholesterol: 200 mg/dL (ref 0–200)
HDL: 47.6 mg/dL (ref >39.00)
LDL Cholesterol: 114 mg/dL — ABNORMAL HIGH (ref 0–99)
NonHDL: 152.48
Total CHOL/HDL Ratio: 4
Triglycerides: 192 mg/dL — ABNORMAL HIGH (ref 0.0–149.0)
VLDL: 38.4 mg/dL (ref 0.0–40.0)

## 2023-07-30 NOTE — Assessment & Plan Note (Signed)
Declines all cancer screening

## 2023-07-30 NOTE — Assessment & Plan Note (Addendum)
Pt declines medication for HTN, DM or cholesterol (despite history of stroke) Agrees to asa only   Does not want to follow up for chronic medical problems   Also declines all health maintenance  Declines immunizations   We have discussed this and I asked her to let us know if she changes her mind

## 2023-07-30 NOTE — Assessment & Plan Note (Signed)
Blood pressure is improved today  BP: 128/68  Working on lifestyle change post cva Declines medication for this  Discussed goals

## 2023-07-30 NOTE — Progress Notes (Signed)
Subjective:    Patient ID: Connie Bell, female    DOB: 12-30-1940, 82 y.o.   MRN: [id number]  HPI  Wt Readings from Last 3 Encounters:  07/30/23 142 lb 6 oz (64.6 kg)  05/05/23 147 lb 6 oz (66.8 kg)  04/20/23 150 lb (68 kg)   25.22 kg/m  Vitals:   07/30/23 1005  BP: 128/68  Pulse: 70  Resp: 16  Temp: 97.9 F (36.6 C)  SpO2: 96%    Pt presents for follow up of CVA and HTN and chronic medical problems   Neuro in hospital recommended asa and plavix for 21 days then asa alone   In past declines fracture for HTN and DM2 and cholesterol Declined neurology or cardiology referral   BP Readings from Last 3 Encounters:  07/30/23 128/68  05/05/23 (!) 145/70  04/28/23 (!) 153/75   Pulse Readings from Last 3 Encounters:  07/30/23 70  05/05/23 60  04/28/23 68   Her cuff runs high   No new stroke symptoms   Blood pressure is significantly better   Did PT/ OT  Had LUE weekness  Was able to demonstrate 39 lb or more of left grip strength (enough to open containers)  Able to meal prep  Able to do light house work  Met all goals   Feels close to baseline  Good strength  Coordination is fairly good / has days when balance is not as good    Some trouble sleeping at night   Has medical alert necklace   Did work on her advance directive   Feels back to normal cognition wise   Reads all the time  Does her own finances Starting to tutor again  - for 1/2 days     Lab Results  Component Value Date   NA 141 04/20/2023   K 3.7 04/20/2023   CO2 25 04/20/2023   GLUCOSE 89 04/20/2023   BUN 11 04/20/2023   CREATININE 0.80 04/20/2023   CALCIUM 9.3 04/20/2023   GFR 68.42 07/19/2019   GFRNONAA >60 04/20/2023   Lab Results  Component Value Date   HGBA1C 6.5 (H) 04/20/2023   Lab Results  Component Value Date   CHOL 230 (H) 04/21/2023   HDL 41 04/21/2023   LDLCALC 131 (H) 04/21/2023   LDLDIRECT 151.0 07/19/2019   TRIG 290 (H) 04/21/2023   CHOLHDL 5.6  04/21/2023     Patient Active Problem List   Diagnosis Date Noted   Patient declines to take medication 07/30/2023   Advance directive discussed with patient 05/05/2023   History of CVA (cerebrovascular accident) 04/20/2023   Vertigo 04/20/2023   Hearing loss 02/18/2023   History of COVID-19 11/24/2019   Type 2 diabetes mellitus without complication, without long-term current use of insulin (HCC) 07/26/2019   Essential hypertension 11/23/2018   Routine general medical examination at a health care facility 07/07/2017   Heme positive stool 08/08/2016   Colon cancer screening 07/14/2016   Family history of colon cancer 07/14/2016   Pre-employment examination 08/26/2013   Hemorrhoids 10/28/2011   HEMORRHOIDS, INTERNAL 01/08/2011   Hyperlipemia 01/12/2008   Depression 01/12/2008   ROSACEA 01/12/2008   Past Medical History:  Diagnosis Date   COVID-19    Depression    Hyperlipemia    borderline not on Rx.   Osteoporosis    Rosacea    Skin cancer    on nose and neck 2 times/basal cell   Past Surgical History:  Procedure Laterality Date  COLONOSCOPY     HEMORRHOID SURGERY     Pt passed out past surgery due to pain!   PARTIAL HYSTERECTOMY     secondary to abn pap   Patient refuses     mammograms, chol treatment and vaccines   POLYPECTOMY     Social History   Tobacco Use   Smoking status: Never   Smokeless tobacco: Never  Vaping Use   Vaping status: Never Used  Substance Use Topics   Alcohol use: No   Drug use: No   Family History  Problem Relation Age of Onset   Heart disease Mother        MI   Cancer Sister 23       lung cancer   Lung cancer Sister    Colon cancer Brother 69       died at 29   Heart disease Father        CHF   Stomach cancer Maternal Aunt    Colon polyps Neg Hx    Esophageal cancer Neg Hx    Rectal cancer Neg Hx    Breast cancer Neg Hx    Allergies  Allergen Reactions   Alendronate Sodium     REACTION: depressed, headaches,  stomach aches   Morphine     REACTION: /whelts   Atorvastatin     REACTION: pt does not remember   Current Outpatient Medications on File Prior to Visit  Medication Sig Dispense Refill   aspirin EC 81 MG tablet Take 1 tablet (81 mg total) by mouth daily. Swallow whole. 30 tablet 2   Cholecalciferol (VITAMIN D3) 25 MCG (1000 UT) CAPS Take 1 capsule by mouth daily.     Multiple Vitamin (MULTIVITAMIN WITH MINERALS) TABS tablet Take 1 tablet by mouth daily.     No current facility-administered medications on file prior to visit.    Review of Systems  Constitutional:  Negative for activity change, appetite change, fatigue, fever and unexpected weight change.  HENT:  Negative for congestion, ear pain, rhinorrhea, sinus pressure and sore throat.   Eyes:  Negative for pain, redness and visual disturbance.  Respiratory:  Negative for cough, shortness of breath and wheezing.   Cardiovascular:  Negative for chest pain and palpitations.  Gastrointestinal:  Negative for abdominal pain, blood in stool, constipation and diarrhea.  Endocrine: Negative for polydipsia and polyuria.  Genitourinary:  Negative for dysuria, frequency and urgency.  Musculoskeletal:  Negative for arthralgias, back pain and myalgias.  Skin:  Negative for pallor and rash.  Allergic/Immunologic: Negative for environmental allergies.  Neurological:  Negative for dizziness, syncope and headaches.       LUE weakness is better   Hematological:  Negative for adenopathy. Does not bruise/bleed easily.  Psychiatric/Behavioral:  Negative for decreased concentration and dysphoric mood. The patient is not nervous/anxious.        Objective:   Physical Exam Constitutional:      General: She is not in acute distress.    Appearance: Normal appearance. She is well-developed and normal weight. She is not ill-appearing or diaphoretic.  HENT:     Head: Normocephalic and atraumatic.     Mouth/Throat:     Mouth: Mucous membranes are  moist.  Eyes:     General: No scleral icterus.    Conjunctiva/sclera: Conjunctivae normal.     Pupils: Pupils are equal, round, and reactive to light.  Neck:     Thyroid: No thyromegaly.     Vascular: No carotid bruit or JVD.  Cardiovascular:     Rate and Rhythm: Normal rate and regular rhythm.     Heart sounds: Normal heart sounds.     No gallop.  Pulmonary:     Effort: Pulmonary effort is normal. No respiratory distress.     Breath sounds: Normal breath sounds. No wheezing or rales.  Abdominal:     General: There is no distension or abdominal bruit.     Palpations: Abdomen is soft.  Musculoskeletal:     Cervical back: Normal range of motion and neck supple.     Right lower leg: No edema.     Left lower leg: No edema.  Lymphadenopathy:     Cervical: No cervical adenopathy.  Skin:    General: Skin is warm and dry.     Coloration: Skin is not pale.     Findings: No rash.  Neurological:     Mental Status: She is alert.     Cranial Nerves: No cranial nerve deficit, dysarthria or facial asymmetry.     Sensory: No sensory deficit.     Motor: No tremor, atrophy, abnormal muscle tone or pronator drift.     Coordination: Romberg sign negative. Coordination normal. Finger-Nose-Finger Test normal. Rapid alternating movements normal.     Gait: Gait is intact. Gait and tandem walk normal.     Deep Tendon Reflexes: Reflexes are normal and symmetric. Reflexes normal.     Comments: Left grip strength is almost 5/5   Psychiatric:        Mood and Affect: Mood normal.           Assessment & Plan:   Problem List Items Addressed This Visit       Cardiovascular and Mediastinum   Essential hypertension - Primary    Blood pressure is improved today  BP: 128/68  Working on lifestyle change post cva Declines medication for this  Discussed goals       Relevant Orders   Lipid panel     Other   Advance directive discussed with patient    Asked pt to bring copy to office to scan       Colon cancer screening    Declines all cancer screening      History of CVA (cerebrovascular accident)    Pt is overall doing better Finished PT/ OT Grip is better (l hand strength has improved -now grip over 39 lb)  Coordination is better   Needs no help with ADLs at all  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  Is released to drive   Still declines treatment for HTN, DM or cholesterol Declines neuro or cardiology ref  Wanted lipid profile today (diet is improved)  She agrees to take asa 81 mg daily and tolerates it well  Discussed signs and symptoms of CVA Instructed to call 911 if any present   Instructed to bring copy of her advance directive to review       Patient declines to take medication    Pt declines medication for HTN, DM or cholesterol (despite history of stroke) Agrees to asa only   Does not want to follow up for chronic medical problems   Also declines all health maintenance  Declines immunizations   We have discussed this and I asked her to let us know if she changes her mind

## 2023-07-30 NOTE — Assessment & Plan Note (Signed)
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

## 2023-07-30 NOTE — Patient Instructions (Addendum)
Use a walker on the days you don'g feel stable   Blood pressure looks better   If you want to monitor at home/ get an arm cuff OMRON for arm size regular   Labs for cholesterol today   Disc goals for lipids and reasons to control them Rev last labs with pt Rev low sat fat diet in detail    Continue aspirin

## 2023-07-30 NOTE — Assessment & Plan Note (Signed)
Asked pt to bring copy to office to scan

## 2024-01-25 DIAGNOSIS — H04123 Dry eye syndrome of bilateral lacrimal glands: Secondary | ICD-10-CM | POA: Diagnosis not present

## 2024-01-25 DIAGNOSIS — H2513 Age-related nuclear cataract, bilateral: Secondary | ICD-10-CM | POA: Diagnosis not present

## 2024-03-24 ENCOUNTER — Ambulatory Visit: Payer: Medicare Other

## 2024-03-24 VITALS — BP 130/64 | Ht 63.0 in | Wt 148.4 lb

## 2024-03-24 DIAGNOSIS — Z0000 Encounter for general adult medical examination without abnormal findings: Secondary | ICD-10-CM | POA: Diagnosis not present

## 2024-03-24 NOTE — Progress Notes (Signed)
 Subjective:   Connie Bell is a 83 y.o. who presents for a Medicare Wellness preventive visit.  As a reminder, Annual Wellness Visits don't include a physical exam, and some assessments may be limited, especially if this visit is performed virtually. We may recommend an in-person visit if needed.  Visit Complete: In person  Persons Participating in Visit: Patient.  AWV Questionnaire: No: Patient Medicare AWV questionnaire was not completed prior to this visit.  Cardiac Risk Factors include: advanced age (>29men, >46 women);diabetes mellitus;dyslipidemia;hypertension     Objective:     Today's Vitals   03/24/24 1544  BP: 130/64  Weight: 148 lb 6.4 oz (67.3 kg)  Height: 5\' 3"  (1.6 m)   Body mass index is 26.29 kg/m.     03/24/2024    4:03 PM 04/28/2023   10:26 AM 04/20/2023    3:41 PM 07/19/2019   10:33 AM 07/16/2018    2:33 PM 07/07/2017    3:15 PM 09/15/2016    1:46 PM  Advanced Directives  Does Patient Have a Medical Advance Directive? Yes No No   No No   Type of Estate agent of [place];Living will   Healthcare Power of [employer] Power of Attorney    Copy of Healthcare Power of Attorney in Chart? No - copy requested   No - copy requested No - copy requested    Would patient like information on creating a medical advance directive?  No - Patient declined No - Patient declined         Significant value    Current Medications (verified) Outpatient Encounter Medications as of 03/24/2024  Medication Sig   aspirin  EC 81 MG tablet Take 1 tablet (81 mg total) by mouth daily. Swallow whole.   Cholecalciferol (VITAMIN D3) 25 MCG (1000 UT) CAPS Take 1 capsule by mouth daily.   Multiple Vitamin (MULTIVITAMIN WITH MINERALS) TABS tablet Take 1 tablet by mouth daily.   No facility-administered encounter medications on file as of 03/24/2024.    Allergies (verified) Alendronate sodium, Morphine, and Atorvastatin   History: Past Medical History:   Diagnosis Date   COVID-19    Depression    Hyperlipemia    borderline not on Rx.   Osteoporosis    Rosacea    Skin cancer    on nose and neck 2 times/basal cell   Past Surgical History:  Procedure Laterality Date   COLONOSCOPY     HEMORRHOID SURGERY     Pt passed out past surgery due to pain!   PARTIAL HYSTERECTOMY     secondary to abn pap   Patient refuses     mammograms, chol treatment and vaccines   POLYPECTOMY     Family History  Problem Relation Age of Onset   Heart disease Mother        MI   Cancer Sister 46       lung cancer   Lung cancer Sister    Colon cancer Brother 17       died at 50   Heart disease Father        CHF   Stomach cancer Maternal Aunt    Colon polyps Neg Hx    Esophageal cancer Neg Hx    Rectal cancer Neg Hx    Breast cancer Neg Hx    Social History   Socioeconomic History   Marital status: Widowed    Spouse name: Not on file   Number of children: 2   Years of  education: Not on file   Highest education level: Not on file  Occupational History   Occupation: employed with school    Employer: RETIRED  Tobacco Use   Smoking status: Never   Smokeless tobacco: Never  Vaping Use   Vaping status: Never Used  Substance and Sexual Activity   Alcohol use: No   Drug use: No   Sexual activity: Not Currently  Other Topics Concern   Not on file  Social History Narrative   Widowed- husband was suicide   Children; twins   Social Drivers of Corporate investment banker Strain: Low Risk  (03/24/2024)   Overall Financial Resource Strain (CARDIA)    Difficulty of Paying Living Expenses: Not hard at all  Food Insecurity: No Food Insecurity (03/24/2024)   Hunger Vital Sign    Worried About Running Out of Food in the Last Year: Never true    Ran Out of Food in the Last Year: Never true  Transportation Needs: No Transportation Needs (03/24/2024)   PRAPARE - Administrator, Civil Service (Medical): No    Lack of Transportation  (Non-Medical): No  Physical Activity: Sufficiently Active (03/24/2024)   Exercise Vital Sign    Days of Exercise per Week: 7 days    Minutes of Exercise per Session: 40 min  Stress: No Stress Concern Present (03/24/2024)   [employer] of Occupational Health - Occupational Stress Questionnaire    Feeling of Stress : Not at all  Social Connections: Moderately Isolated (03/24/2024)   Social Connection and Isolation Panel [NHANES]    Frequency of Communication with Friends and Family: More than three times a week    Frequency of Social Gatherings with Friends and Family: More than three times a week    Attends Religious Services: More than 4 times per year    Active Member of [employer] or Organizations: No    Attends Banker Meetings: Never    Marital Status: Widowed    Tobacco Counseling Counseling given: Not Answered    Clinical Intake:  Pre-visit preparation completed: Yes  Pain : No/denies pain     BMI - recorded: 26.29 Nutritional Status: BMI 25 -29 Overweight Nutritional Risks: None Diabetes: Yes CBG done?: No Did pt. bring in CBG monitor from home?: No  Lab Results  Component Value Date   HGBA1C 6.5 (H) 04/20/2023   HGBA1C 5.8 02/23/2020   HGBA1C 6.4 (A) 11/24/2019     How often do you need to have someone help you when you read instructions, pamphlets, or other written materials from your doctor or pharmacy?: 1 - Never  Interpreter Needed?: No  Comments: lives alone Information entered by :: B.Kynedi Profitt,LPN   Activities of Daily Living     03/24/2024    3:57 PM 04/20/2023   11:00 PM  In your present state of health, do you have any difficulty performing the following activities:  Hearing? 0 1  Vision? 0 0  Difficulty concentrating or making decisions? 0 0  Walking or climbing stairs? 0 1  Dressing or bathing? 0 0  Doing errands, shopping? 0 0  Preparing Food and eating ? N   Using the Toilet? N   In the past six months, have you accidently  leaked urine? N   Do you have problems with loss of bowel control? N   Managing your Medications? N   Managing your Finances? N   Housekeeping or managing your Housekeeping? N     Patient Care Team: [place],  Manley Seeds, MD as PCP - Auther Bo, MD as Consulting Physician (Ophthalmology)  Indicate any recent Medical Services you may have received from other than [facility] providers in the past year (date may be approximate).     Assessment:    This is a routine wellness examination for Connie Bell.  Hearing/Vision screen Hearing Screening - Comments:: Pt says she has hearing aids and they help to hear better Vision Screening - Comments:: Pt says vision is good w/glasses Dr Ladoris Piedra opthal   Goals Addressed             This Visit's Progress    Increase physical activity   On track    03/24/24- I will continue to walk 60 minutes 3 days per week.        Depression Screen     03/24/2024    3:53 PM 07/30/2023   10:08 AM 07/19/2019   10:35 AM 07/16/2018    2:32 PM 07/07/2017    3:00 PM  PHQ 2/9 Scores  PHQ - 2 Score 0 0 0 0 1  PHQ- 9 Score  3 1 0 1    Fall Risk     03/24/2024    3:48 PM 07/30/2023   10:08 AM 07/19/2019   10:35 AM 07/16/2018    2:32 PM 07/07/2017    3:00 PM  Fall Risk   Falls in the past year? 0 0 0 No No  Number falls in past yr: 0 0     Injury with Fall? 0 0     Risk for fall due to : No Fall Risks No Fall Risks Medication side effect    Follow up Education provided;Falls prevention discussed Falls evaluation completed Falls evaluation completed;Falls prevention discussed      MEDICARE RISK AT HOME:  Medicare Risk at Home Any stairs in or around the home?: No If so, are there any without handrails?: No Home free of loose throw rugs in walkways, pet beds, electrical cords, etc?: Yes Adequate lighting in your home to reduce risk of falls?: Yes Life alert?: Yes Use of a cane, walker or w/c?: No Grab bars in the bathroom?: Yes Shower chair or bench  in shower?: Yes Elevated toilet seat or a handicapped toilet?: Yes  TIMED UP AND GO:  Was the test performed?  Yes  Length of time to ambulate 10 feet: 10 sec Gait steady and fast without use of assistive device  Cognitive Function: 6CIT completed    07/19/2019   10:38 AM 07/16/2018    2:33 PM 07/07/2017    3:24 PM  MMSE - Mini Mental State Exam  Orientation to time 5 5 5   Orientation to Place 5 5 5   Registration 3 3 3   Attention/ Calculation 5 0 0  Recall 3 3 2   Recall-comments   pt was unable to recall 1 of 3 words  Language- name 2 objects 0 0 0  Language- repeat 1 1 1   Language- follow 3 step command 0 3 3  Language- read & follow direction 0 0 0  Write a sentence 0 0 0  Copy design 0 0 0  Total score 22 20 19         03/24/2024    4:03 PM  6CIT Screen  What Year? 0 points  What month? 0 points  What time? 0 points  Count back from 20 0 points  Months in reverse 0 points  Repeat phrase 0 points  Total Score 0 points  Immunizations Immunization History  Administered Date(s) Administered   Fluad Quad(high Dose 65+) 07/19/2019   Influenza, High Dose Seasonal PF 08/26/2013, 07/14/2016, 09/10/2017, 08/19/2018   Influenza,inj,Quad PF,6+ Mos 08/26/2013, 07/14/2016, 08/19/2018   Influenza-Unspecified 08/17/2014   PPD Test 07/20/2013   Td 11/17/1989    Screening Tests Health Maintenance  Topic Date Due   Zoster Vaccines- Shingrix (1 of 2) Never done   OPHTHALMOLOGY EXAM  03/18/2020   FOOT EXAM  07/25/2020   Diabetic kidney evaluation - Urine ACR  11/23/2020   COVID-19 Vaccine (1 - 2024-25 season) Never done   HEMOGLOBIN A1C  10/20/2023   Diabetic kidney evaluation - eGFR measurement  04/19/2024   Pneumonia Vaccine 55+ Years old (1 of 2 - PCV) 07/29/2024 (Originally 09/26/1960)   MAMMOGRAM  07/29/2024 (Originally 02/21/2023)   Colonoscopy  07/29/2024 (Originally 01/05/2021)   INFLUENZA VACCINE  06/17/2024   Medicare Annual Wellness (AWV)  03/24/2025   DEXA SCAN   Completed   HPV VACCINES  Aged Out   Meningococcal B Vaccine  Aged Out   DTaP/Tdap/Td  Discontinued    Health Maintenance  Health Maintenance Due  Topic Date Due   Zoster Vaccines- Shingrix (1 of 2) Never done   OPHTHALMOLOGY EXAM  03/18/2020   FOOT EXAM  07/25/2020   Diabetic kidney evaluation - Urine ACR  11/23/2020   COVID-19 Vaccine (1 - 2024-25 season) Never done   HEMOGLOBIN A1C  10/20/2023   Diabetic kidney evaluation - eGFR measurement  04/19/2024   Health Maintenance Items Addressed: None needed   Additional Screening:  Vision Screening: Recommended annual ophthalmology exams for early detection of glaucoma and other disorders of the eye.  Dental Screening: Recommended annual dental exams for proper oral hygiene  Community Resource Referral / Chronic Care Management: CRR required this visit?  No   CCM required this visit?  No   Plan:    I have personally reviewed and noted the following in the patient's chart:   Medical and social history Use of alcohol, tobacco or illicit drugs  Current medications and supplements including opioid prescriptions. Patient is not currently taking opioid prescriptions. Functional ability and status Nutritional status Physical activity Advanced directives List of other physicians Hospitalizations, surgeries, and ER visits in previous 12 months Vitals Screenings to include cognitive, depression, and falls Referrals and appointments  In addition, I have reviewed and discussed with patient certain preventive protocols, quality metrics, and best practice recommendations. A written personalized care plan for preventive services as well as general preventive health recommendations were provided to patient.   Connie SCOLARO, LPN   11/22/1094   After Visit Summary: (MyChart) Due to this being a telephonic visit, the after visit summary with patients personalized plan was offered to patient via MyChart   Notes: Nothing  significant to report at this time.

## 2024-03-24 NOTE — Patient Instructions (Addendum)
 Connie Bell , Thank you for taking time out of your busy schedule to complete your Annual Wellness Visit with me. I enjoyed our conversation and look forward to speaking with you again next year. I, as well as your care team,  appreciate your ongoing commitment to your health goals. Please review the following plan we discussed and let me know if I can assist you in the future. Your Game plan/ To Do List    Follow up Visits: Next Medicare AWV with our clinical staff: 03/28/25 @ 3:40pm   Have you seen your provider in the last 6 months (3 months if uncontrolled diabetes)? no Next Office Visit with your provider: pt declined to make. Will obtain PCP in [place] where she moved  Clinician Recommendations:  Aim for 30 minutes of exercise or brisk walking, 6-8 glasses of water, and 5 servings of fruits and vegetables each day.       This is a list of the screening recommended for you and due dates:  Health Maintenance  Topic Date Due   Zoster (Shingles) Vaccine (1 of 2) Never done   Eye exam for diabetics  03/18/2020   Complete foot exam   07/25/2020   Yearly kidney health urinalysis for diabetes  11/23/2020   COVID-19 Vaccine (1 - 2024-25 season) Never done   Hemoglobin A1C  10/20/2023   Yearly kidney function blood test for diabetes  04/19/2024   Pneumonia Vaccine (1 of 2 - PCV) 07/29/2024*   Mammogram  07/29/2024*   Colon Cancer Screening  07/29/2024*   Flu Shot  06/17/2024   Medicare Annual Wellness Visit  03/24/2025   DEXA scan (bone density measurement)  Completed   HPV Vaccine  Aged Out   Meningitis B Vaccine  Aged Out   DTaP/Tdap/Td vaccine  Discontinued  *Topic was postponed. The date shown is not the original due date.    Advanced directives: (Copy Requested) Please bring a copy of your health care power of attorney and living will to the office to be added to your chart at your convenience. You can mail to [facility] [address] [place], [place] [address] or  email to [email or web address] Advance Care Planning is important because it:  [x]  Makes sure you receive the medical care that is consistent with your values, goals, and preferences [x]  It provides guidance to your family and loved ones and reduces their decisional burden about whether or not they are making the right decisions based on your wishes.  Follow the link provided in your after visit summary or read over the paperwork we have mailed to you to help you started getting your Advance Directives in place. If you need assistance in completing these, please reach out to us  so that we can help you!

## 2024-08-30 NOTE — Progress Notes (Signed)
 CHANIQUE DUCA                                          MRN: [id number]   08/30/2024   The [facility] Quality Team Specialist reviewed this patient medical record for the purposes of chart review for care gap closure. The following were reviewed: chart review for care gap closure-kidney health evaluation for diabetes:eGFR  and uACR. Per office documentation, pt refused follow up on chronic conditions.     [facility]BCI Quality Team

## 2024-10-05 NOTE — Progress Notes (Signed)
 Connie Bell                                          MRN: [id number]   10/05/2024   The [facility]BCI Quality Team Specialist reviewed this patient medical record for the purposes of chart review for care gap closure. The following were reviewed: chart review for care gap closure-kidney health evaluation for diabetes:eGFR  and uACR.    VBCI Quality Team

## 2024-11-07 NOTE — Progress Notes (Signed)
 SOLE LENGACHER                                          MRN: [id number]   11/07/2024   The [facility]BCI Quality Team Specialist reviewed this patient medical record for the purposes of chart review for care gap closure. The following were reviewed: chart review for care gap closure-kidney health evaluation for diabetes:eGFR  and uACR.    VBCI Quality Team

## 2025-03-28 ENCOUNTER — Ambulatory Visit
